# Patient Record
Sex: Female | Born: 1940 | Race: White | Hispanic: No | Smoking: Never smoker
Health system: Southern US, Community
[De-identification: ages and names within clinical notes are randomized; demographics above are authoritative.]

## PROBLEM LIST (undated history)

## (undated) DIAGNOSIS — I10 Essential (primary) hypertension: Secondary | ICD-10-CM

## (undated) DIAGNOSIS — E039 Hypothyroidism, unspecified: Secondary | ICD-10-CM

## (undated) DIAGNOSIS — E7800 Pure hypercholesterolemia, unspecified: Secondary | ICD-10-CM

## (undated) DIAGNOSIS — H409 Unspecified glaucoma: Secondary | ICD-10-CM

## (undated) DIAGNOSIS — M47812 Spondylosis without myelopathy or radiculopathy, cervical region: Secondary | ICD-10-CM

## (undated) DIAGNOSIS — G43109 Migraine with aura, not intractable, without status migrainosus: Secondary | ICD-10-CM

## (undated) DIAGNOSIS — M542 Cervicalgia: Secondary | ICD-10-CM

## (undated) HISTORY — DX: Spondylosis without myelopathy or radiculopathy, cervical region: M47.812

## (undated) HISTORY — DX: Pure hypercholesterolemia, unspecified: E78.00

## (undated) HISTORY — PX: TOTAL ABDOMINAL HYSTERECTOMY: SHX209

## (undated) HISTORY — DX: Unspecified glaucoma: H40.9

## (undated) HISTORY — DX: Hypothyroidism, unspecified: E03.9

## (undated) HISTORY — DX: Migraine with aura, not intractable, without status migrainosus: G43.109

## (undated) HISTORY — DX: Essential (primary) hypertension: I10

## (undated) HISTORY — DX: Cervicalgia: M54.2

---

## 1976-04-20 HISTORY — PX: CHOLECYSTECTOMY: SHX55

## 1997-07-16 ENCOUNTER — Ambulatory Visit (HOSPITAL_COMMUNITY): Admission: RE | Admit: 1997-07-16 | Discharge: 1997-07-16 | Payer: Self-pay | Admitting: Obstetrics and Gynecology

## 2010-04-20 HISTORY — PX: COLONOSCOPY: SHX174

## 2014-05-28 ENCOUNTER — Other Ambulatory Visit (HOSPITAL_COMMUNITY): Payer: Self-pay | Admitting: Internal Medicine

## 2014-05-28 DIAGNOSIS — G4489 Other headache syndrome: Secondary | ICD-10-CM

## 2014-05-30 ENCOUNTER — Ambulatory Visit (HOSPITAL_COMMUNITY)
Admission: RE | Admit: 2014-05-30 | Discharge: 2014-05-30 | Disposition: A | Discharge status: Home / Self Care | Payer: PPO | Source: Ambulatory Visit | Attending: Internal Medicine | Admitting: Internal Medicine

## 2014-05-30 DIAGNOSIS — R55 Syncope and collapse: Secondary | ICD-10-CM | POA: Insufficient documentation

## 2014-05-30 DIAGNOSIS — H539 Unspecified visual disturbance: Secondary | ICD-10-CM | POA: Insufficient documentation

## 2014-05-30 DIAGNOSIS — G4489 Other headache syndrome: Secondary | ICD-10-CM

## 2014-05-30 DIAGNOSIS — R0989 Other specified symptoms and signs involving the circulatory and respiratory systems: Secondary | ICD-10-CM | POA: Insufficient documentation

## 2014-05-30 DIAGNOSIS — I10 Essential (primary) hypertension: Secondary | ICD-10-CM | POA: Diagnosis not present

## 2014-07-12 ENCOUNTER — Other Ambulatory Visit (HOSPITAL_COMMUNITY): Payer: Self-pay | Admitting: Internal Medicine

## 2014-07-12 ENCOUNTER — Ambulatory Visit (HOSPITAL_COMMUNITY)
Admission: RE | Admit: 2014-07-12 | Discharge: 2014-07-12 | Disposition: A | Discharge status: Home / Self Care | Payer: PPO | Source: Ambulatory Visit | Attending: Internal Medicine | Admitting: Internal Medicine

## 2014-07-12 DIAGNOSIS — R202 Paresthesia of skin: Secondary | ICD-10-CM

## 2014-07-12 DIAGNOSIS — M5030 Other cervical disc degeneration, unspecified cervical region: Secondary | ICD-10-CM | POA: Insufficient documentation

## 2014-07-12 IMAGING — DX DG CERVICAL SPINE COMPLETE 4+V
6 series · 8 of 8 positions shown · non-contrast
Comparison: None.

CLINICAL DATA: Chronic pain.  Paresthesias.  No known injury.

EXAM:
CERVICAL SPINE  4+ VIEWS

[c-spine lat]
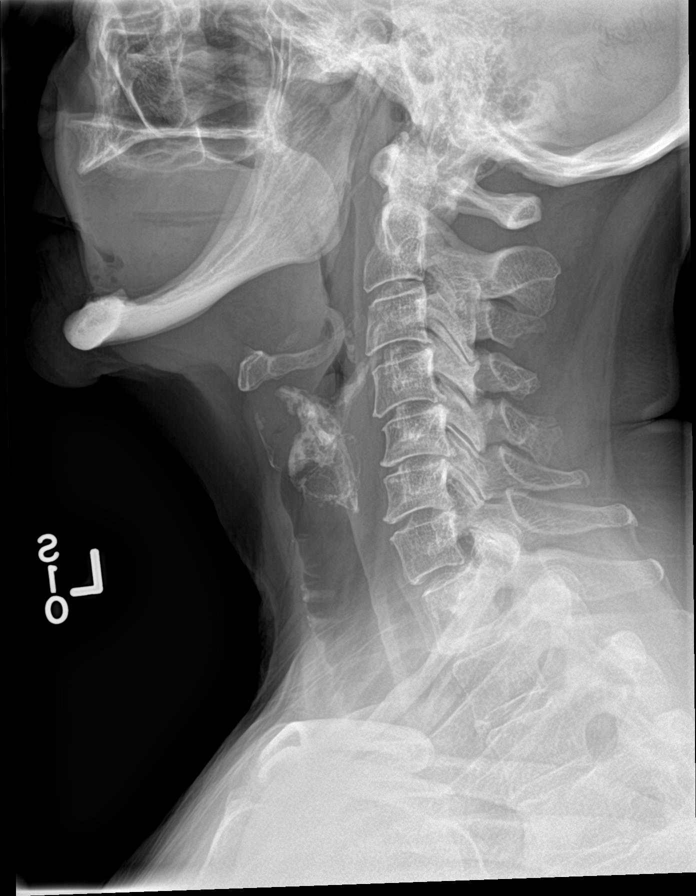

[c-spine obl (1 of 2)]
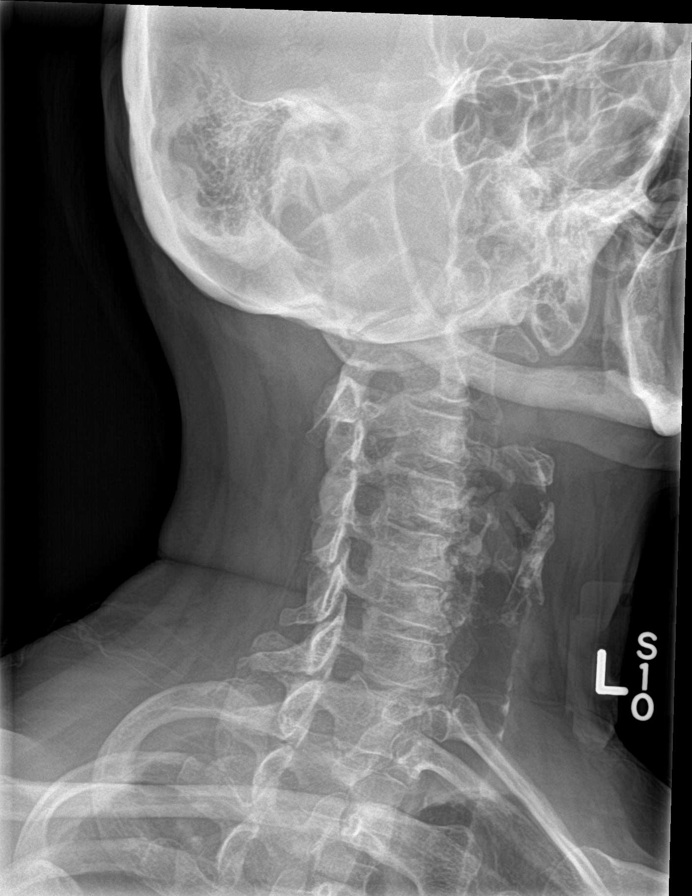

[c-spine obl (2 of 2)]
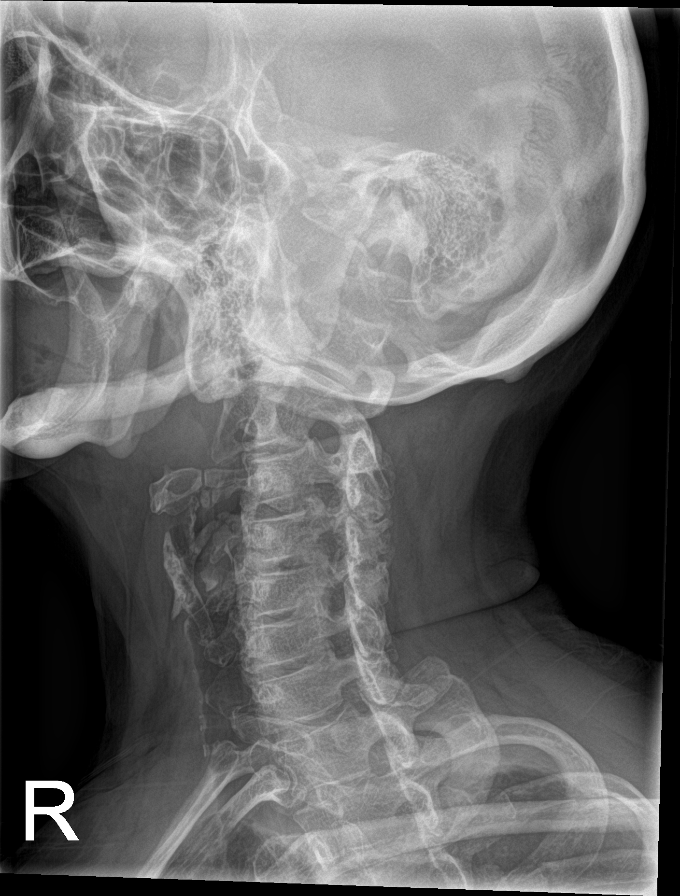

[c-spine ap]
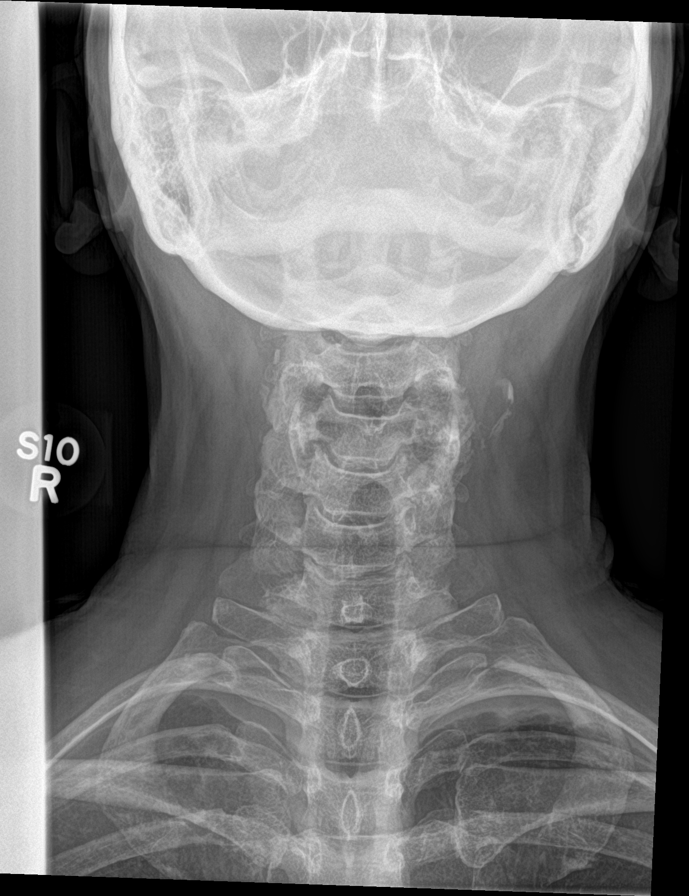

[Series 5: c-spine open mouth · 0.14mm/px · 3 of 3 slices shown]
[im 1/3]
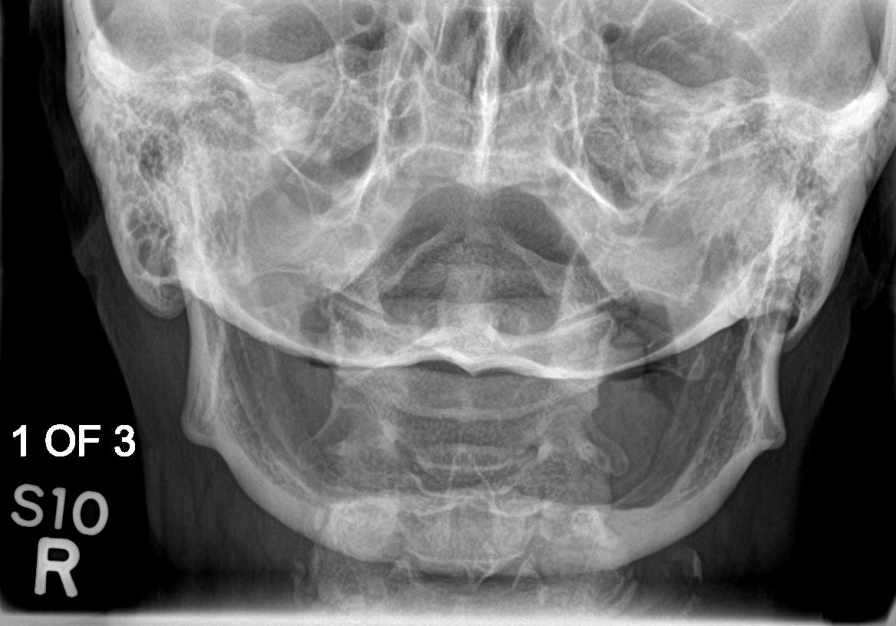
[im 2/3]
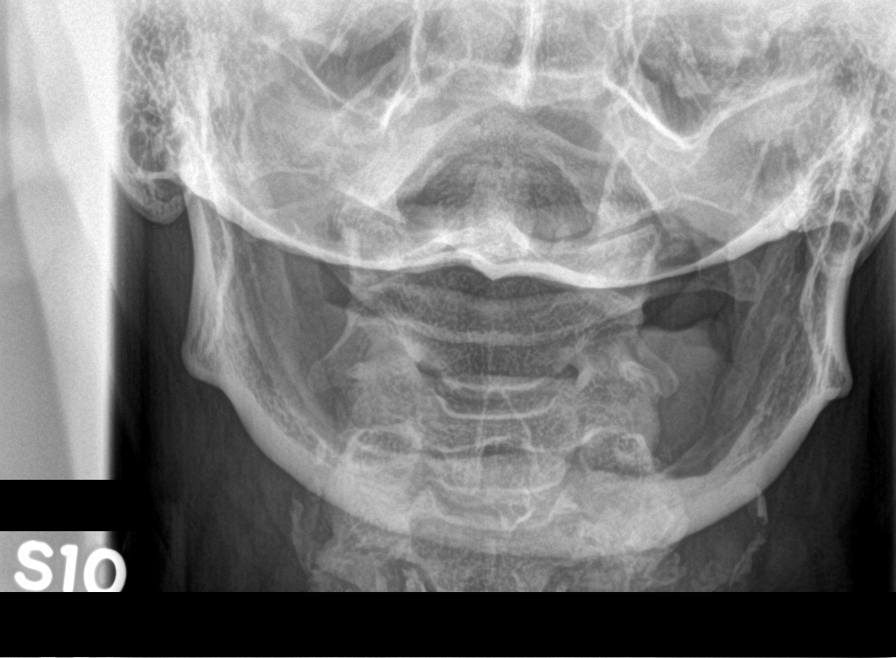
[im 3/3]
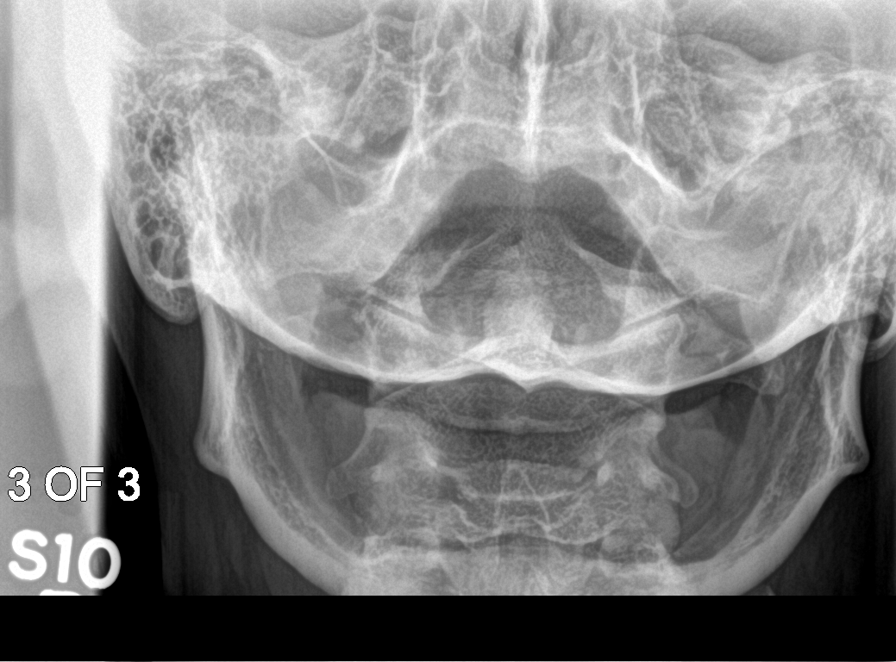

[[person_name]]
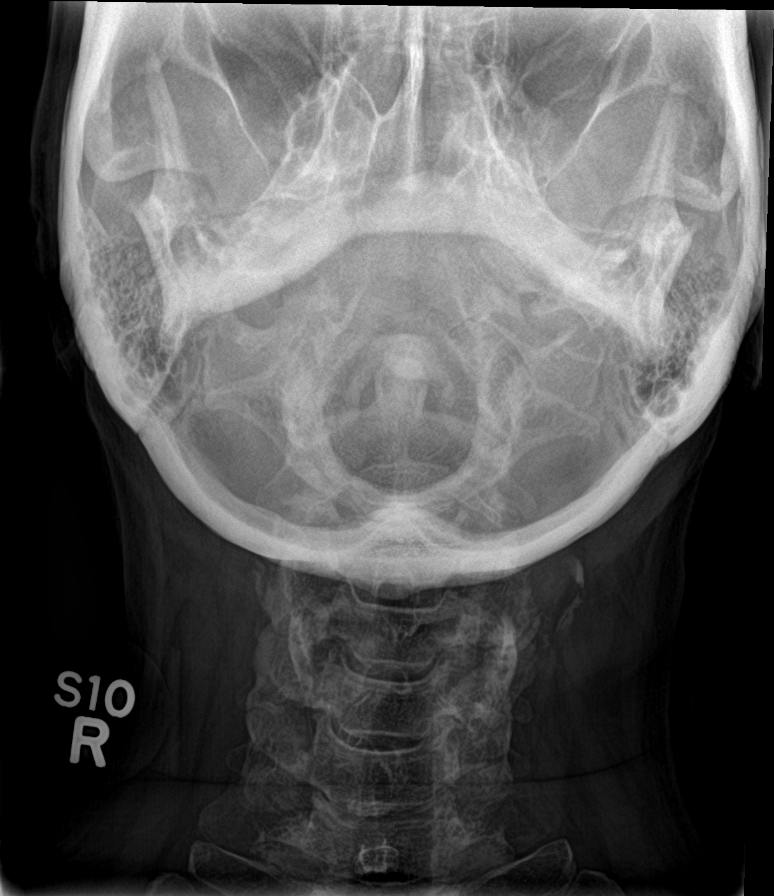

[8 of 8 positions shown; findings below may reference images not displayed]

FINDINGS: Diffuse multilevel degenerative change with endplate osteophyte
formation and disc space loss. Minimal anterolisthesis C4 on C5 of
approximately 2 mm noted. This may be degenerative. Mild narrowing
of the left C3-C4 and C4-C5 neural foramen. No evidence of fracture
dislocation. Carotid vascular calcification biapical pleural
parenchymal thickening noted most consistent with scarring.
IMPRESSION: 1. Degenerative changes cervical spine with minimal 2 mm
anterolisthesis C4 on C5, most likely degenerative. No acute
abnormality. Mild degenerative narrowing the left C3-C4 and C4-C5
neuroforamen.
2. Bilateral carotid atherosclerotic vascular disease.
3. Biapical pleural parenchymal scarring.

## 2014-07-30 ENCOUNTER — Ambulatory Visit (INDEPENDENT_AMBULATORY_CARE_PROVIDER_SITE_OTHER): Payer: PPO | Admitting: Neurology

## 2014-07-30 ENCOUNTER — Encounter: Payer: Self-pay | Admitting: Neurology

## 2014-07-30 VITALS — BP 145/69 | HR 65 | Ht 64.0 in | Wt 157.2 lb

## 2014-07-30 DIAGNOSIS — M47812 Spondylosis without myelopathy or radiculopathy, cervical region: Secondary | ICD-10-CM

## 2014-07-30 DIAGNOSIS — G43109 Migraine with aura, not intractable, without status migrainosus: Secondary | ICD-10-CM

## 2014-07-30 HISTORY — DX: Migraine with aura, not intractable, without status migrainosus: G43.109

## 2014-07-30 HISTORY — DX: Spondylosis without myelopathy or radiculopathy, cervical region: M47.812

## 2014-07-30 MED ORDER — ONDANSETRON 4 MG PO TBDP: 4.0000 mg | ORAL_TABLET | Freq: Three times a day (TID) | ORAL | Status: DC | PRN

## 2014-07-30 MED ORDER — GABAPENTIN 100 MG PO CAPS: ORAL_CAPSULE | ORAL | Status: DC

## 2014-07-30 MED ORDER — MELOXICAM 15 MG PO TABS: 15.0000 mg | ORAL_TABLET | Freq: Every day | ORAL | Status: DC

## 2014-07-30 NOTE — Progress Notes (Signed)
Reason for visit: Headache  Referring physician: Dr. Wende Neighbors  Tamara Sandoval is a 74 y.o. female  History of present illness:  Ms. Tamara Sandoval is a 74 year old right-handed white female with a history of classic migraine headache. The patient had only occasional headaches going up, but 5 years ago, she began having more frequent headache events. The headaches were associated with flashing lights in the left visual field that would gradually spread to the right side. The visual changes would last about 2 hours, and then a mild headache would ensue occurring along the frontal regions bilaterally. The headache will last about an hour. The patient has had only one other severe headache within the last 5 years, but she may get mild headaches that will come on about once a week. The headaches are associated with visual disturbances only in the left homonymous visual field, and the headaches are not severe. The patient may have some nausea, no vomiting. There may be some photophobia and phonophobia with the headache. She describes the headache as a pressure sensation. The patient may feel spacey after the headache is gone. She may have some dizziness with the headache, and some slight gait instability. The patient is also concerned about chronic neck discomfort that has been present again about 5 years. The patient has stiffness in the neck, and pain down into the shoulders and up behind the ears. More recently she has noted some discomfort into the right shoulder and upper arm that is a new feature. She indicates that the pain and stiffness increases with turning her head from one side to the next. She has undergone an x-ray of the cervical spine shows some spondylolisthesis at the C4-5 level. She has undergone CT scan evaluation of the brain that was unremarkable. She indicates that her daughter has migraine headaches that are frequent. She denies any issues controlling the bowels or the bladder. She is sent to  this office for further evaluation. She does not know of any particular activating factors for her headache.  Past Medical History  Diagnosis Date  . Glaucoma   . Hypertension   . Neck pain   . Hypercholesteremia   . 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 Cervical spondylosis without myelopathy 07/30/2014    Past Surgical History  Procedure Laterality Date  . Total abdominal hysterectomy    . Cholecystectomy  1978    Family History  Problem Relation Age of Onset  . Pneumonia Mother   . Tuberculosis Mother   . Hypertension Sister   . Cancer Brother   . Hypertension Maternal Aunt   . Dementia Paternal Aunt   . Cancer Cousin     ovarian  . Hypercholesterolemia Cousin   . Hypertension Sister     Social history:  reports that she has never smoked. She has never used smokeless tobacco. She reports that she does not drink alcohol or use illicit drugs.  Medications:  Prior to Admission medications   Medication Sig Start Date End Date Taking? Authorizing Provider  estradiol (ESTRACE) 1 MG tablet Take 1 mg by mouth daily.   Yes Historical Provider, MD  GuaiFENesin (COUGH SYRUP PO) Take by mouth.   Yes Historical Provider, MD  LATANOPROST OP Apply 1 drop to eye daily.   Yes Historical Provider, MD  levothyroxine (SYNTHROID, LEVOTHROID) 100 MCG tablet Take 100 mcg by mouth daily before breakfast.   Yes Historical Provider, MD  lisinopril (PRINIVIL,ZESTRIL) 10 MG tablet Take 10 mg by mouth daily.   Yes  Historical Provider, MD  naproxen sodium (ANAPROX) 220 MG tablet Take 220 mg by mouth as needed.   Yes Historical Provider, MD  omeprazole (PRILOSEC) 20 MG capsule Take 20 mg by mouth daily.   Yes Historical Provider, MD  potassium gluconate 595 MG TABS tablet Take 595 mg by mouth daily.   Yes Historical Provider, MD  timolol (BETIMOL) 0.5 % ophthalmic solution Place 1 drop into both eyes daily.   Yes Historical Provider, MD  triamterene-hydrochlorothiazide (DYAZIDE) 37.5-25 MG per capsule Take  1 capsule by mouth daily.   Yes Historical Provider, MD      Allergies  Allergen Reactions  . Bactrim [Sulfamethoxazole-Trimethoprim] Rash    ROS:  Out of a complete 14 system review of symptoms, the patient complains only of the following symptoms, and all other reviewed systems are negative.  Blurred vision Cough, wheezing Easy bruising Joint pain  Blood pressure 145/69, pulse 65, height 5\' 4"  (1.626 m), weight 157 lb 3.2 oz (71.305 kg).  Physical Exam  General: The patient is alert and cooperative at the time of the examination.  Eyes: Pupils are equal, round, and reactive to light. Discs are flat bilaterally.  Neck: The neck is supple, no carotid bruits are noted.  Respiratory: The respiratory examination is clear.  Cardiovascular: The cardiovascular examination reveals a regular rate and rhythm, no obvious murmurs or rubs are noted.  Neuromuscular: The patient lacks about 15 of lateral rotation of the cervical spine bilaterally.  Skin: Extremities are without significant edema.  Neurologic Exam  Mental status: The patient is alert and oriented x 3 at the time of the examination. The patient has apparent normal recent and remote memory, with an apparently normal attention span and concentration ability.  Cranial nerves: Facial symmetry is present. There is good sensation of the face to pinprick and soft touch bilaterally. The strength of the facial muscles and the muscles to head turning and shoulder shrug are normal bilaterally. Speech is well enunciated, no aphasia or dysarthria is noted. Extraocular movements are full. Visual fields are full. The tongue is midline, and the patient has symmetric elevation of the soft palate. No obvious hearing deficits are noted.  Motor: The motor testing reveals 5 over 5 strength of all 4 extremities. Good symmetric motor tone is noted throughout.  Sensory: Sensory testing is intact to pinprick, soft touch, vibration sensation, and  position sense on all 4 extremities. No evidence of extinction is noted.  Coordination: Cerebellar testing reveals good finger-nose-finger and heel-to-shin bilaterally.  Gait and station: Gait is normal. Tandem gait is normal. Romberg is negative. No drift is seen.  Reflexes: Deep tendon reflexes are symmetric and normal bilaterally. Toes are downgoing bilaterally.   CT brain 05/30/14:  IMPRESSION: Negative head CT.   Assessment/Plan:  1. Classic migraine  2. Cervical spondylosis, chronic neck pain  The patient appears to have a history consistent with classic migraine. Her daughter also has migraine headache. The patient indicates that her headaches are less bothersome to her than her neck discomfort currently. She has ongoing daily neck pain and stiffness, and some pain down into the right upper arm. The patient may have some slight irritation of the right C5 nerve root. She will be set up for neuromuscular therapy, and gabapentin will be added to the regimen taking 100 mg 3 times daily for 2 weeks, then go to 200 mg 3 times daily. She will be placed on Mobic 15 mg daily for the next 6-8 weeks. She will follow-up  in about 3 months. If the pain does not improve, MRI evaluation of the cervical spine can be done.  Jill Alexanders MD 07/30/2014 2:04 PM  [facility] [address] [place], [place] [address]  Phone [phone] Fax [phone]

## 2014-07-30 NOTE — Patient Instructions (Signed)

## 2014-07-31 ENCOUNTER — Telehealth: Payer: Self-pay | Admitting: Neurology

## 2014-07-31 NOTE — Telephone Encounter (Signed)
Angie Pharmacy Tech with [facility] @ [phone], stated insurance is questioning if patient is cancer patient for Rx ondansetron (ZOFRAN ODT) 4 MG disintegrating tablet.  If so would she use this medication after Chemo treatments.  Please call and advise.

## 2014-07-31 NOTE — Telephone Encounter (Signed)
This medication is being prescribed for nausea/vomiting associated with Migraine Headaches.  I called back.  Spoke with Angie.  Provided diagnosis.  She has noted fie and will contact us if anything further is needed.

## 2014-08-28 ENCOUNTER — Ambulatory Visit: Payer: PPO | Attending: Neurology

## 2014-08-28 DIAGNOSIS — G8929 Other chronic pain: Secondary | ICD-10-CM | POA: Insufficient documentation

## 2014-08-28 DIAGNOSIS — M79601 Pain in right arm: Secondary | ICD-10-CM | POA: Insufficient documentation

## 2014-08-28 DIAGNOSIS — R29898 Other symptoms and signs involving the musculoskeletal system: Secondary | ICD-10-CM | POA: Diagnosis not present

## 2014-08-28 DIAGNOSIS — M25611 Stiffness of right shoulder, not elsewhere classified: Secondary | ICD-10-CM

## 2014-08-28 DIAGNOSIS — M25511 Pain in right shoulder: Secondary | ICD-10-CM | POA: Insufficient documentation

## 2014-08-28 DIAGNOSIS — M542 Cervicalgia: Secondary | ICD-10-CM | POA: Diagnosis not present

## 2014-08-28 NOTE — Therapy (Signed)
[facility] [address] [place], [place], [address] Phone: [phone]   Fax:  [phone]  Physical Therapy Evaluation  Patient Details  Name: Tamara Sandoval MRN: [id number] Date of Birth: 06-21-1940 Referring Provider:  Kathrynn Ducking, MD  Encounter Date: 08/28/2014      PT End of Session - 08/28/14 1742    Visit Number 1   Number of Visits 17   Date for PT Re-Evaluation 10/27/14   Authorization Type [employer]-G code required every 10th visit   PT Start Time 1230   PT Stop Time 1320   PT Time Calculation (min) 50 min      Past Medical History  Diagnosis Date  . Glaucoma   . Hypertension   . Neck pain   . Hypercholesteremia   . 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 Cervical spondylosis without myelopathy 07/30/2014    Past Surgical History  Procedure Laterality Date  . Total abdominal hysterectomy    . Cholecystectomy  1978    There were no vitals filed for this visit.  Visit Diagnosis:  Neck pain of over 3 months duration  Right arm pain  Decreased range of motion of neck  Decreased range of motion of right shoulder      Subjective Assessment - 08/28/14 1239    Subjective Pt has had pain and swelling on the right side of the arm and neck for (and pain progressing to R shoulder) about 6 months and on the left side for 1 year. The right side is progressively worsening and left side stays at a constant level of pain and swelling, not worsening over time.  Pt has been taking anti-inflammatory medication for the pain since she saw her neurologist last month. She feels like the medication is not helping much. She uses heat at night and this is occasionally helpful. Pt reports that at night she has a tough time finding a comfortable position and must straighten right elbow to get some relief.     Pertinent History Hx of left shoulder rotator cuff surgery 3 years ago; Pt had the Flu in January and again  in March of this year.    Patient Stated Goals decrease neck and arm pain, increase strength of arm, and increasing the range of motion of her neck    Currently in Pain? Yes   Pain Score 5    Pain Location --  both sides of neck, right shoulder/arm,    Pain Descriptors / Indicators --  R arm pain is occasionally burning and occasionally sharp, occasionally has paresthesias in left and right hand,    Pain Type Chronic pain   Pain Onset --  6-12 months ago   Pain Frequency Constant   Aggravating Factors  movement of the arms/shoulders/neck   Pain Relieving Factors medication helps only a little, heat also helps   Effect of Pain on Daily Activities unable to use the R arm for bathing, and chores, or even pouring coffee            [facility] PT Assessment - 08/28/14 0001    Assessment   Medical Diagnosis Cervical Spondylosis M47.812   Onset Date --  1 year ago   Prior Therapy L shoulder replacement 3 years ago   Precautions   Precautions None   Balance Screen   Has the patient fallen in the past 6 months No   How many times? 0   Has the patient had a decrease in activity level  because of a fear of falling?  Yes  reports she has decrased climbing ladders because of a fear   Is the patient reluctant to leave their home because of a fear of falling?  No   Home Environment   Living Enviornment Private residence   Living Arrangements Spouse/significant other   Available Help at Discharge Family   Type of Meta to enter   Entrance Stairs-Number of Steps 4   Entrance Stairs-Rails Left   Home Layout Two level   Alternate Level Stairs-Number of Steps 14   Prior Function   Level of Independence Independent with gait;Independent with transfers  currently independent   Leisure reading, sewing/quilting,    Observation/Other Assessments   Focus on Therapeutic Outcomes (FOTO)  Functional Status18   Other Surveys  Select  Neck Disability Index (NDI) 40%    Posture/Postural Control   Posture/Postural Control Postural limitations   Postural Limitations Forward head;Posterior pelvic tilt      Observation: Swelling of upper traps region bilateral, sterno cleido mastiod region, and front upper portion of neck and chest  AROM: Full AROM of : shoulder flexion bilaterally (with overactive upper traps), for shoulder abduction pt able to abduct to 90 degrees bilaterally then moves in to scaption position for remaining 90 degrees; decreased R shoulder internal rotation unable to reach mid back with RUE (reaches buttock only); has full shoulder external rotation of RUE but has pain with return to neutral. Shoulder extension limited to 50%  LUE all AROM WNL  RUE strength Biceps and triceps 5/5 Shoulder flexion 4-/5 painful end feel Shoulder extension 4+/5 Shoulder abduction 3+/5  Neck AROM  L rotation 20 degrees (ipsilateral "pulling" sensation) R rotation 24 degrees (pt feels ipsilateral "pulling" sensation) Flexion and extension also limited Sidebending left and right very limited (most limited aspect of neck AROM)  Other subjective reports:  -Only able to hold a book to read for 10 minutes prior to onset of pan and/or paresthesias in both hands. -Must use LUE to assist hand for lifting coffee pot or to assist for cooking, doing laundry or washing dishes -Unable to use the R hand to wash her back while bathing due to pain and decreased AROM -due to decreased cervical AROM has difficulty scanning environment while driving (must turn whole body to look into the next lane), and has difficulty extending neck to look into cupboard while doing dishes.                        PT Education - 08/28/14 1741    Education provided Yes   Education Details pt taught to perform sterno cleido mastoid stretch, also taught to perform self lymphatic drainage and demonstrated correct performance of each of these. Did not have time to print  handouts on eval day   Person(s) Educated Patient   Methods Explanation;Demonstration;Tactile cues;Verbal cues   Comprehension Verbalized understanding;Returned demonstration          PT Short Term Goals - 08/28/14 1751    PT SHORT TERM GOAL #1   Title Pt will demonstrate ability to perform cervical rotation R to 30 degrees for improved scanning environment. Target 09/28/14   PT SHORT TERM GOAL #2   Title Pt will perform cervical rotation left to 25 degrees for improved scanning of environment. Target 09/28/14   PT SHORT TERM GOAL #3   Title Pt will demonstrate correct performance of HEP for improved neck AROM, R shoulder  AROM and strength. Target 09/28/14   PT SHORT TERM GOAL #4   Title Pt will report or demonstrate ability to read book for >15 minutes prior to experiencing paresthesias in hands for increased quailty of life. (was 10 minutes on eval) Target 09/28/14   PT SHORT TERM GOAL #5   Title Pt will report ability to use RUE independently for unlaoding 50% of dishwasher for increased RUE strength and range of motion for IADLs. Target 09/28/14           PT Long Term Goals - Sep 21, 2014 1801    PT LONG TERM GOAL #1   Title Pt will improve neck disability index to 25% disability on FOTO NDI test. Target 10/28/14   PT LONG TERM GOAL #2   Title Pt will increase neck AROM rotation R to 35 degress for improving ability to scan environment while driving. Target 10/28/14   PT LONG TERM GOAL #3   Title Pt will incrase neck AROM rotation L to 30 degrees for improving ability to scan environemnt. Target 10/28/14   PT LONG TERM GOAL #4   Title Pt will report increased ease with looking into cupboards due to increased cervical extension and decrased pain. Target 10/28/14   PT LONG TERM GOAL #5   Title Pt will demonstrate ability to reach her mid back with the RUE to assist with bathing for improved hygiene. Target 10/28/14               Plan - 09/21/14 1743    Clinical Impression  Statement Pt has multifactorial neck pain and R upper extremity pain, weakness and decreased AROM. Pt's neck pain appears to be affected by increased fluid retention/swelling of the neck and upper torso as well as spondylolisthesis of C4-C5 which was present on x-ray. Pt has had an unremarkable brain CT scan. Pt will benefit from skilled PT services for shoulder strengthening, increasing neck and shoulder AROM and decreasing pain so that pt is able to perform her daily activities more independently.    Pt will benefit from skilled therapeutic intervention in order to improve on the following deficits Pain;Increased edema;Impaired flexibility;Decreased strength;Postural dysfunction;Decreased range of motion;Impaired UE functional use   Rehab Potential Good   PT Frequency 2x / week   PT Duration 8 weeks   PT Treatment/Interventions ADLs/Self Care Home Management;Patient/family education;Therapeutic exercise;Neuromuscular re-education;Manual techniques;Passive range of motion;Traction;Therapeutic activities;Manual lymph drainage   PT Next Visit Plan Reivew sterno cleido mastoid stretch and self-manual lymph drainage taught on eval--provide typed HEP for these and other neck stretching and R shoulder stretching/strengthing. Manual therapy and traction as needed.           G-Codes - 21-Sep-2014 1808    Functional Assessment Tool Used Neck Disability Index (FOTO) 40%   Functional Limitation Changing and maintaining body position   Changing and Maintaining Body Position Current Status (W2993) At least 40 percent but less than 60 percent impaired, limited or restricted   Changing and Maintaining Body Position Goal Status (Z1696) At least 20 percent but less than 40 percent impaired, limited or restricted       Problem List Patient Active Problem List   Diagnosis Date Noted  . 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 Cervical spondylosis without myelopathy 07/30/2014    Delrae Sawyers,  PT,DPT,NCS 21-Sep-2014 6:10 PM Phone [phone] FAX [phone]         [facility] [address] [place], [place], [address] Phone: [phone]   Fax:  [phone]

## 2014-09-05 ENCOUNTER — Ambulatory Visit (HOSPITAL_COMMUNITY): Payer: PPO | Attending: Neurology | Admitting: Physical Therapy

## 2014-09-05 DIAGNOSIS — M542 Cervicalgia: Secondary | ICD-10-CM

## 2014-09-05 DIAGNOSIS — M25611 Stiffness of right shoulder, not elsewhere classified: Secondary | ICD-10-CM

## 2014-09-05 DIAGNOSIS — G8929 Other chronic pain: Secondary | ICD-10-CM

## 2014-09-05 DIAGNOSIS — R29898 Other symptoms and signs involving the musculoskeletal system: Secondary | ICD-10-CM

## 2014-09-05 DIAGNOSIS — M5382 Other specified dorsopathies, cervical region: Secondary | ICD-10-CM | POA: Diagnosis not present

## 2014-09-05 DIAGNOSIS — M25512 Pain in left shoulder: Secondary | ICD-10-CM | POA: Insufficient documentation

## 2014-09-05 DIAGNOSIS — M25511 Pain in right shoulder: Secondary | ICD-10-CM | POA: Insufficient documentation

## 2014-09-05 DIAGNOSIS — M25811 Other specified joint disorders, right shoulder: Secondary | ICD-10-CM | POA: Insufficient documentation

## 2014-09-05 DIAGNOSIS — M79601 Pain in right arm: Secondary | ICD-10-CM

## 2014-09-05 DIAGNOSIS — M25812 Other specified joint disorders, left shoulder: Secondary | ICD-10-CM | POA: Diagnosis not present

## 2014-09-05 NOTE — Therapy (Signed)
[facility] [place], [place], [address] Phone: [phone]   Fax:  [phone]  Physical Therapy Treatment  Patient Details  Name: Tamara Sandoval MRN: [id number] Date of Birth: 18-Sep-1940 Referring Provider:  Kathrynn Ducking, MD  Encounter Date: 09/05/2014      PT End of Session - 09/05/14 1613    Visit Number 2   Number of Visits 17   Date for PT Re-Evaluation 10/27/14   Authorization Type [employer]-G code required every 10th visit   PT Start Time 1520   PT Stop Time 1605   PT Time Calculation (min) 45 min   Activity Tolerance Patient tolerated treatment well      Past Medical History  Diagnosis Date  . Glaucoma   . Hypertension   . Neck pain   . Hypercholesteremia   . 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 Cervical spondylosis without myelopathy 07/30/2014    Past Surgical History  Procedure Laterality Date  . Total abdominal hysterectomy    . Cholecystectomy  1978    There were no vitals filed for this visit.  Visit Diagnosis:  Neck pain of over 3 months duration  Right arm pain  Decreased range of motion of neck  Decreased range of motion of right shoulder      Subjective Assessment - 09/05/14 1528    Subjective Ms. Hettich states that she probably has not been doing her exercises as she should be.    Currently in Pain? Yes   Pain Score 8    Pain Location Neck   Pain Orientation Right;Left   Pain Descriptors / Indicators Aching   Pain Type Chronic pain   Pain Frequency Constant               [facility] Adult PT Treatment/Exercise - 09/05/14 0001    Exercises   Exercises Neck   Neck Exercises: Seated   Neck Retraction 5 reps   Cervical Rotation Both;5 reps   Lateral Flexion Both;5 reps   Shoulder Shrugs 5 reps   Shoulder Flexion Both;5 reps;Other (comment)   Shoulder Flexion Weights (lbs) wand    Other Seated Exercise scapular retraction x 10    Manual Therapy   Manual Therapy  Soft tissue mobilization   Manual therapy comments marked mm spasm B upper trap area.                 PT Education - 09/05/14 1612    Education provided Yes   Education Details New exercises for 3-D cervical excursion as well as shoulder shrugs and B UE flexion with wand    Person(s) Educated Patient   Methods Explanation;Demonstration   Comprehension Returned demonstration;Verbalized understanding          PT Short Term Goals - 09/05/14 1615    PT SHORT TERM GOAL #1   Title Pt will demonstrate ability to perform cervical rotation R to 30 degrees for improved scanning environment. Target 09/28/14   Status On-going   PT SHORT TERM GOAL #2   Title Pt will perform cervical rotation left to 25 degrees for improved scanning of environment. Target 09/28/14   Status On-going   PT SHORT TERM GOAL #3   Title Pt will demonstrate correct performance of HEP for improved neck AROM, R shoulder AROM and strength. Target 09/28/14   Status On-going   PT SHORT TERM GOAL #4   Title Pt will report or demonstrate ability to read book for >15 minutes prior to  experiencing paresthesias in hands for increased quailty of life. (was 10 minutes on eval) Target 09/28/14   Status On-going   PT SHORT TERM GOAL #5   Title Pt will report ability to use RUE independently for unlaoding 50% of dishwasher for increased RUE strength and range of motion for IADLs. Target 09/28/14   Status On-going           PT Long Term Goals - 09/05/14 1616    PT LONG TERM GOAL #1   Title Pt will improve neck disability index to 25% disability on FOTO NDI test. Target 10/28/14   Status On-going   PT LONG TERM GOAL #2   Title Pt will increase neck AROM rotation R to 35 degress for improving ability to scan environment while driving. Target 10/28/14   Status On-going   PT LONG TERM GOAL #3   Title Pt will incrase neck AROM rotation L to 30 degrees for improving ability to scan environemnt. Target 10/28/14   Status On-going    PT LONG TERM GOAL #4   Title Pt will report increased ease with looking into cupboards due to increased cervical extension and decrased pain. Target 10/28/14   Status On-going   PT LONG TERM GOAL #5   Title Pt will demonstrate ability to reach her mid back with the RUE to assist with bathing for improved hygiene. Target 10/28/14   Status On-going               Plan - 09/05/14 1613    Clinical Impression Statement Pt cervical mm tested with noted weakness.  Pt has marked mm spasm in B trap that was decreased but not abolished with manual techniques.  Pt has limited cervical and shoulder ROM and strength.  Pain decreased to a 5/10 after sesssion.    PT Next Visit Plan begin cervical isometrics, W-back and T-band for posture.         Problem List Patient Active Problem List   Diagnosis Date Noted  . 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 Cervical spondylosis without myelopathy 07/30/2014   Rayetta Humphrey, PT CLT [phone] 09/05/2014, 4:17 PM  [facility] [address] [place], [place], [address] Phone: [phone]   Fax:  [phone]

## 2014-09-05 NOTE — Patient Instructions (Signed)
Scapular Retraction (Standing)   With arms at sides, pinch shoulder blades together. Repeat __10__ times per set. Do _1___ sets per session. Do __3__ sessions per day.  http://orth.exer.us/944   Copyright  VHI. All rights reserved.  ROM: Flexion - Wand   Bring wand directly over head, leading with right side. Reach back until stretch is felt. Hold __3__ seconds. Repeat _10___ times per set. Do __1__ sets per session. Do _2___ sessions per day.  http://orth.exer.us/744   Copyright  VHI. All rights reserved.  Strengthening: Shoulder Shrug (Phase 1)   Shrug shoulders up  Backward now relax  Repeat __10__ times per set. Do __1__ sets per session. Do __2__ sessions per day.  http://orth.exer.us/336   Copyright  VHI. All rights reserved.  Flexibility: Neck Retraction   Pull head straight back, keeping eyes and jaw level. Repeat _10___ times per set. Do ___1_ sets per session. Do __2__ sessions per day.  http://orth.exer.us/344   Copyright  VHI. All rights reserved.

## 2014-09-12 ENCOUNTER — Telehealth (HOSPITAL_COMMUNITY): Payer: Self-pay | Admitting: Physical Therapy

## 2014-09-12 ENCOUNTER — Ambulatory Visit (HOSPITAL_COMMUNITY): Payer: PPO | Admitting: Physical Therapy

## 2014-09-12 NOTE — Telephone Encounter (Signed)
Patient a no show for her appointment on 5/25. Attempted to call patient however she did not answer phone call; left message detailing date and time of next appointment.  Deniece Ree PT, DPT [phone]

## 2014-09-13 ENCOUNTER — Other Ambulatory Visit (HOSPITAL_COMMUNITY): Payer: Self-pay | Admitting: Internal Medicine

## 2014-09-13 DIAGNOSIS — Z1231 Encounter for screening mammogram for malignant neoplasm of breast: Secondary | ICD-10-CM

## 2014-09-13 DIAGNOSIS — M810 Age-related osteoporosis without current pathological fracture: Secondary | ICD-10-CM

## 2014-09-14 ENCOUNTER — Ambulatory Visit (HOSPITAL_COMMUNITY): Payer: PPO

## 2014-09-14 ENCOUNTER — Telehealth (HOSPITAL_COMMUNITY): Payer: Self-pay

## 2014-09-14 DIAGNOSIS — M25611 Stiffness of right shoulder, not elsewhere classified: Secondary | ICD-10-CM

## 2014-09-14 DIAGNOSIS — M79601 Pain in right arm: Secondary | ICD-10-CM

## 2014-09-14 DIAGNOSIS — G8929 Other chronic pain: Principal | ICD-10-CM

## 2014-09-14 DIAGNOSIS — M542 Cervicalgia: Secondary | ICD-10-CM | POA: Diagnosis not present

## 2014-09-14 DIAGNOSIS — R29898 Other symptoms and signs involving the musculoskeletal system: Secondary | ICD-10-CM

## 2014-09-14 NOTE — Therapy (Signed)
[place], [place], [address] Phone: [phone]   Fax:  [phone]  Physical Therapy Treatment  Patient Details  Name: Tamara Sandoval MRN: [id number] Date of Birth: January 22, 1941 Referring Provider:  Kathrynn Ducking, MD  Encounter Date: 09/14/2014      PT End of Session - 09/14/14 1543    Visit Number 3   Number of Visits 17   Date for PT Re-Evaluation 10/27/14   Authorization Type [employer]-G code required every 10th visit   Authorization - Visit Number 3   Authorization - Number of Visits 10   PT Start Time 3474   PT Stop Time 1430   PT Time Calculation (min) 53 min   Activity Tolerance Patient tolerated treatment well   Behavior During Therapy [facility] for tasks assessed/performed      Past Medical History  Diagnosis Date  . Glaucoma   . Hypertension   . Neck pain   . Hypercholesteremia   . 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 Cervical spondylosis without myelopathy 07/30/2014    Past Surgical History  Procedure Laterality Date  . Total abdominal hysterectomy    . Cholecystectomy  1978    There were no vitals filed for this visit.  Visit Diagnosis:  Neck pain of over 3 months duration  Right arm pain  Decreased range of motion of neck  Decreased range of motion of right shoulder      Subjective Assessment - 09/14/14 1539    Subjective Pt stated she continues to have lateral side and posterior neck pain with radicular symptoms past Rt elbow.     Currently in Pain? Yes   Pain Score 4    Pain Location Neck   Pain Orientation Right;Left   Pain Descriptors / Indicators Aching               [facility] Adult PT Treatment/Exercise - 09/14/14 0001    Exercises   Exercises Neck   Neck Exercises: Theraband   Scapula Retraction 10 reps;Green   Shoulder Extension 10 reps;Green   Rows 10 reps;Green   Neck Exercises: Seated   Cervical Isometrics Right lateral flexion;Left lateral  flexion;Right rotation;Left rotation;5 secs;5 reps   Neck Retraction 10 reps;5 secs   Cervical Rotation Both;10 reps   Cervical Rotation Limitations 3D cervical excursion   Lateral Flexion Both;10 reps   Lateral Flexion Limitations Rt 90 degrees; Lt 100 degrees   Shoulder Shrugs 10 reps   Shoulder Shrugs Limitations shoulders up, back, down and relax   Neck Exercises: Stretches   Upper Trapezius Stretch 3 reps;30 seconds   Corner Stretch 3 reps;30 seconds                PT Education - 09/14/14 1555    Education provided Yes   Education Details Education on proper posture landmarks   Person(s) Educated Patient   Methods Explanation;Demonstration   Comprehension Verbalized understanding;Returned demonstration          PT Short Term Goals - 09/14/14 1545    PT SHORT TERM GOAL #1   Title Pt will demonstrate ability to perform cervical rotation R to 30 degrees for improved scanning environment. Target 09/28/14   Status On-going   PT SHORT TERM GOAL #2   Title Pt will perform cervical rotation left to 25 degrees for improved scanning of environment. Target 09/28/14   Status On-going   PT SHORT TERM GOAL #3   Title Pt will demonstrate correct performance of  HEP for improved neck AROM, R shoulder AROM and strength. Target 09/28/14   Status On-going   PT SHORT TERM GOAL #4   Title Pt will report or demonstrate ability to read book for >15 minutes prior to experiencing paresthesias in hands for increased quailty of life. (was 10 minutes on eval) Target 09/28/14   PT SHORT TERM GOAL #5   Title Pt will report ability to use RUE independently for unlaoding 50% of dishwasher for increased RUE strength and range of motion for IADLs. Target 09/28/14   Status On-going           PT Long Term Goals - 09/14/14 1546    PT LONG TERM GOAL #1   Title Pt will improve neck disability index to 25% disability on FOTO NDI test. Target 10/28/14   PT LONG TERM GOAL #2   Title Pt will increase  neck AROM rotation R to 35 degress for improving ability to scan environment while driving. Target 10/28/14   PT LONG TERM GOAL #3   Title Pt will incrase neck AROM rotation L to 30 degrees for improving ability to scan environemnt. Target 10/28/14   PT LONG TERM GOAL #4   Title Pt will report increased ease with looking into cupboards due to increased cervical extension and decrased pain. Target 10/28/14   PT LONG TERM GOAL #5   Title Pt will demonstrate ability to reach her mid back with the RUE to assist with bathing for improved hygiene. Target 10/28/14               Plan - 09/14/14 1556    Clinical Impression Statement Pt educated on proper posutre landmarks.  Progressed cervical strengthening with isometrics and began posture strengthening exercises.  Pt c/o pulling in Bil pecs and upper and mid traps during [facility].  Added corner and upper trap stretches to improve flexibilty.  Therapist facilitation for proper form and techniques with exercises.  Ended session with manual to reduce spasms upper, mid trap and rhomboid region for pain control.  Encouraged pt to stay hydratted to reduce risk of headache following manual.     PT Next Visit Plan Next session begin 3D thoracic excursion initially with UE across chest then progress with UE movements.  Continue cervical isometric strenghtening then HEP.  Continue cervical mobilty and progress posture strengthening.          Problem List Patient Active Problem List   Diagnosis Date Noted  . 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 Cervical spondylosis without myelopathy 07/30/2014   Aldona Lento, PTA  Aldona Lento 09/14/2014, 4:41 PM  [facility] [address] [place], [place], [address] Phone: [phone]   Fax:  [phone]

## 2014-09-18 ENCOUNTER — Encounter (HOSPITAL_COMMUNITY): Payer: PPO

## 2014-09-20 ENCOUNTER — Encounter (HOSPITAL_COMMUNITY): Payer: PPO

## 2014-09-25 ENCOUNTER — Ambulatory Visit (HOSPITAL_COMMUNITY): Payer: PPO | Attending: Neurology | Admitting: Physical Therapy

## 2014-09-25 DIAGNOSIS — M25811 Other specified joint disorders, right shoulder: Secondary | ICD-10-CM | POA: Diagnosis not present

## 2014-09-25 DIAGNOSIS — M5382 Other specified dorsopathies, cervical region: Secondary | ICD-10-CM | POA: Insufficient documentation

## 2014-09-25 DIAGNOSIS — M542 Cervicalgia: Secondary | ICD-10-CM | POA: Insufficient documentation

## 2014-09-25 DIAGNOSIS — R29898 Other symptoms and signs involving the musculoskeletal system: Secondary | ICD-10-CM

## 2014-09-25 DIAGNOSIS — G8929 Other chronic pain: Secondary | ICD-10-CM

## 2014-09-25 DIAGNOSIS — M25812 Other specified joint disorders, left shoulder: Secondary | ICD-10-CM | POA: Diagnosis not present

## 2014-09-25 DIAGNOSIS — M79601 Pain in right arm: Secondary | ICD-10-CM

## 2014-09-25 DIAGNOSIS — M25512 Pain in left shoulder: Secondary | ICD-10-CM | POA: Diagnosis not present

## 2014-09-25 DIAGNOSIS — M25511 Pain in right shoulder: Secondary | ICD-10-CM | POA: Diagnosis not present

## 2014-09-25 DIAGNOSIS — M25611 Stiffness of right shoulder, not elsewhere classified: Secondary | ICD-10-CM

## 2014-09-25 NOTE — Therapy (Signed)
[place], [place], [address] Phone: [phone]   Fax:  [phone]  Physical Therapy Treatment  Patient Details  Name: Tamara Sandoval MRN: [id number] Date of Birth: 10-23-1940 Referring Provider:  Delphina Cahill, MD  Encounter Date: 09/25/2014      PT End of Session - 09/25/14 1742    Visit Number 4   Number of Visits 17   Date for PT Re-Evaluation 10/27/14   Authorization Type [employer]-G code required every 10th visit   Authorization - Visit Number 4   Authorization - Number of Visits 10   PT Start Time 2458   PT Stop Time 1600   PT Time Calculation (min) 42 min   Activity Tolerance Patient tolerated treatment well   Behavior During Therapy [facility] for tasks assessed/performed      Past Medical History  Diagnosis Date  . Glaucoma   . Hypertension   . Neck pain   . Hypercholesteremia   . 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 Cervical spondylosis without myelopathy 07/30/2014    Past Surgical History  Procedure Laterality Date  . Total abdominal hysterectomy    . Cholecystectomy  1978    There were no vitals filed for this visit.  Visit Diagnosis:  Neck pain of over 3 months duration  Right arm pain  Decreased range of motion of neck  Decreased range of motion of right shoulder      Subjective Assessment - 09/25/14 1526    Subjective Pt states her neck is feeling better over.  Reports it as a 5/10, however states this is low for her (it is higher than last visit report of 4/10)   Currently in Pain? Yes   Pain Score 5    Pain Location Neck   Pain Orientation Other (Comment)                         [facility] Adult PT Treatment/Exercise - 09/25/14 1528    Neck Exercises: Theraband   Scapula Retraction 10 reps;Green   Shoulder Extension 10 reps;Green   Rows 10 reps;Green   Neck Exercises: Seated   Neck Retraction 10 reps;5 secs   Cervical Rotation Both;10 reps   Cervical  Rotation Limitations 3D cervical excursion   Lateral Flexion Both;10 reps   Lateral Flexion Limitations Rt 90 degrees; Lt 100 degrees   Shoulder Shrugs 10 reps   Shoulder Shrugs Limitations shoulders up, back, down and relax   Shoulder Flexion 10 reps   Shoulder Flexion Weights (lbs) wand 1#   Shoulder ABduction Both;10 reps   Shoulder Abduction Weights (lbs) wand 1#   Other Seated Exercise thoracic 2D excursions UE's crossed 10 reps   Manual Therapy   Manual Therapy Soft tissue mobilization   Manual therapy comments bilateral UT in seated position   Neck Exercises: Stretches   Upper Trapezius Stretch 3 reps;30 seconds   Corner Stretch 10 seconds  10 reps                  PT Short Term Goals - 09/14/14 1545    PT SHORT TERM GOAL #1   Title Pt will demonstrate ability to perform cervical rotation R to 30 degrees for improved scanning environment. Target 09/28/14   Status On-going   PT SHORT TERM GOAL #2   Title Pt will perform cervical rotation left to 25 degrees for improved scanning of environment. Target 09/28/14   Status On-going  PT SHORT TERM GOAL #3   Title Pt will demonstrate correct performance of HEP for improved neck AROM, R shoulder AROM and strength. Target 09/28/14   Status On-going   PT SHORT TERM GOAL #4   Title Pt will report or demonstrate ability to read book for >15 minutes prior to experiencing paresthesias in hands for increased quailty of life. (was 10 minutes on eval) Target 09/28/14   PT SHORT TERM GOAL #5   Title Pt will report ability to use RUE independently for unlaoding 50% of dishwasher for increased RUE strength and range of motion for IADLs. Target 09/28/14   Status On-going           PT Long Term Goals - 09/14/14 1546    PT LONG TERM GOAL #1   Title Pt will improve neck disability index to 25% disability on FOTO NDI test. Target 10/28/14   PT LONG TERM GOAL #2   Title Pt will increase neck AROM rotation R to 35 degress for improving  ability to scan environment while driving. Target 10/28/14   PT LONG TERM GOAL #3   Title Pt will incrase neck AROM rotation L to 30 degrees for improving ability to scan environemnt. Target 10/28/14   PT LONG TERM GOAL #4   Title Pt will report increased ease with looking into cupboards due to increased cervical extension and decrased pain. Target 10/28/14   PT LONG TERM GOAL #5   Title Pt will demonstrate ability to reach her mid back with the RUE to assist with bathing for improved hygiene. Target 10/28/14               Plan - 09/25/14 1615    Clinical Impression Statement Visibly improved posture.  Continued with established exercises.  Pt with noted reduction in cervical ROM; Pt unable to side bend to Rt without rotation preceding movment.  Pt with very tight upper traps with multiple spasms.   Able to reduce with trigger point and solft tissue mobilization.   Added thoracic excursions with good form .  Also with improving Rt UE strength   PT Next Visit Plan Progress 3D thoracic exursions with UE movements.  Continue cervical isometric strenghtening then HEP.  Continue cervical mobilty and progress posture strengthening.          Problem List Patient Active Problem List   Diagnosis Date Noted  . 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 Cervical spondylosis without myelopathy 07/30/2014    Teena Irani, PTA/CLT [phone]  09/25/2014, 5:46 PM  [place], [place], [address] Phone: [phone]   Fax:  [phone]

## 2014-09-26 ENCOUNTER — Other Ambulatory Visit (HOSPITAL_COMMUNITY): Payer: Self-pay | Admitting: Internal Medicine

## 2014-09-26 DIAGNOSIS — M810 Age-related osteoporosis without current pathological fracture: Secondary | ICD-10-CM

## 2014-09-27 ENCOUNTER — Ambulatory Visit (HOSPITAL_COMMUNITY): Payer: PPO | Admitting: Physical Therapy

## 2014-09-27 ENCOUNTER — Ambulatory Visit (HOSPITAL_COMMUNITY)
Admission: RE | Admit: 2014-09-27 | Discharge: 2014-09-27 | Disposition: A | Discharge status: Home / Self Care | Payer: PPO | Source: Ambulatory Visit | Attending: Internal Medicine | Admitting: Internal Medicine

## 2014-09-27 DIAGNOSIS — Z780 Asymptomatic menopausal state: Secondary | ICD-10-CM | POA: Insufficient documentation

## 2014-09-27 DIAGNOSIS — M79601 Pain in right arm: Secondary | ICD-10-CM

## 2014-09-27 DIAGNOSIS — M810 Age-related osteoporosis without current pathological fracture: Secondary | ICD-10-CM

## 2014-09-27 DIAGNOSIS — G8929 Other chronic pain: Secondary | ICD-10-CM

## 2014-09-27 DIAGNOSIS — M25611 Stiffness of right shoulder, not elsewhere classified: Secondary | ICD-10-CM

## 2014-09-27 DIAGNOSIS — M542 Cervicalgia: Secondary | ICD-10-CM

## 2014-09-27 DIAGNOSIS — R29898 Other symptoms and signs involving the musculoskeletal system: Secondary | ICD-10-CM

## 2014-09-27 NOTE — Therapy (Signed)
[place], [place], [address] Phone: [phone]   Fax:  [phone]  Physical Therapy Treatment  Patient Details  Name: Tamara Sandoval MRN: [id number] Date of Birth: 1940/06/13 Referring Provider:  Delphina Cahill, MD  Encounter Date: 09/27/2014      PT End of Session - 09/27/14 1609    Visit Number 5   Number of Visits 17   Date for PT Re-Evaluation 10/27/14   Authorization Type [employer]-G code required every 10th visit   Authorization - Visit Number 5   Authorization - Number of Visits 10   PT Start Time 1520   PT Stop Time 1357   PT Time Calculation (min) 1357 min   Activity Tolerance Patient tolerated treatment well   Behavior During Therapy [facility] for tasks assessed/performed      Past Medical History  Diagnosis Date  . Glaucoma   . Hypertension   . Neck pain   . Hypercholesteremia   . 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 Cervical spondylosis without myelopathy 07/30/2014    Past Surgical History  Procedure Laterality Date  . Total abdominal hysterectomy    . Cholecystectomy  1978    There were no vitals filed for this visit.  Visit Diagnosis:  Right arm pain  Neck pain of over 3 months duration  Decreased range of motion of neck  Decreased range of motion of right shoulder      Subjective Assessment - 09/27/14 1526    Subjective Neck is about 10% better but generally about the same really. Massage type stuff seems to help. Not feeling quite up to par today.   Pain Score 4    Pain Location Neck                         [facility] Adult PT Treatment/Exercise - 09/27/14 0001    Neck Exercises: Theraband   Scapula Retraction 10 reps;Green   Scapula Retraction Limitations heavy manual cues for correct motion, poor scapular control   Other Theraband Exercises modified retraction to sitting position without band and emphasis on motor control for retraction.    Neck Exercises:  Seated   Shoulder Shrugs 10 reps   Shoulder Shrugs Limitations shoulders up, back, down and relax   Other Seated Exercise levator stretch (nose toward axilla) bilaterally X 5 reps, 20second holds   Manual Therapy   Manual Therapy Soft tissue mobilization   Manual therapy comments bilateral UT, levator scapular, rhomboids; in seated position   Neck Exercises: Stretches   Corner Stretch 5 reps;20 seconds  cues for longer stretch with less repetitions.                PT Education - 09/27/14 1606    Education provided Yes   Education Details reinforced scapular motion and how to perform at home. focus on scapular retraction, corner stretch, levator scapulae stretch.    Person(s) Educated Patient   Methods Explanation;Demonstration;Tactile cues;Verbal cues   Comprehension Verbalized understanding;Need further instruction          PT Short Term Goals - 09/14/14 1545    PT SHORT TERM GOAL #1   Title Pt will demonstrate ability to perform cervical rotation R to 30 degrees for improved scanning environment. Target 09/28/14   Status On-going   PT SHORT TERM GOAL #2   Title Pt will perform cervical rotation left to 25 degrees for improved scanning of environment. Target 09/28/14   Status  On-going   PT SHORT TERM GOAL #3   Title Pt will demonstrate correct performance of HEP for improved neck AROM, R shoulder AROM and strength. Target 09/28/14   Status On-going   PT SHORT TERM GOAL #4   Title Pt will report or demonstrate ability to read book for >15 minutes prior to experiencing paresthesias in hands for increased quailty of life. (was 10 minutes on eval) Target 09/28/14   PT SHORT TERM GOAL #5   Title Pt will report ability to use RUE independently for unlaoding 50% of dishwasher for increased RUE strength and range of motion for IADLs. Target 09/28/14   Status On-going           PT Long Term Goals - 09/14/14 1546    PT LONG TERM GOAL #1   Title Pt will improve neck disability  index to 25% disability on FOTO NDI test. Target 10/28/14   PT LONG TERM GOAL #2   Title Pt will increase neck AROM rotation R to 35 degress for improving ability to scan environment while driving. Target 10/28/14   PT LONG TERM GOAL #3   Title Pt will incrase neck AROM rotation L to 30 degrees for improving ability to scan environemnt. Target 10/28/14   PT LONG TERM GOAL #4   Title Pt will report increased ease with looking into cupboards due to increased cervical extension and decrased pain. Target 10/28/14   PT LONG TERM GOAL #5   Title Pt will demonstrate ability to reach her mid back with the RUE to assist with bathing for improved hygiene. Target 10/28/14               Plan - 09/27/14 1610    Clinical Impression Statement Patient with very poor scapular motor control with noted compensatory shoulder extension instead of scapular retraction. Heavy verbal and tactile cues needed for true scapular retraction. Patient also to work on upper trapezius and levator scapulae stretches. Patient will need to continue to benefit from ongoing sessions to work on scapular mobility and strength.    PT Frequency 2x / week   PT Treatment/Interventions ADLs/Self Care Home Management;Patient/family education;Therapeutic exercise;Neuromuscular re-education;Manual techniques;Passive range of motion;Traction;Therapeutic activities;Manual lymph drainage   PT Next Visit Plan Recommend verbal and tactile cues for correct scapular motion, especially retraction. Gently soft tissue work per patient preference.    PT Home Exercise Plan Scapular mobilization and strengthening when able to master motor control.    Consulted and Agree with Plan of Care Patient        Problem List Patient Active Problem List   Diagnosis Date Noted  . 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 Cervical spondylosis without myelopathy 07/30/2014    Cassell Clement, PT, CSCS  09/27/2014, 4:20 PM  [facility] [address] [place], [place], [address] Phone: [phone]   Fax:  [phone]

## 2014-09-28 ENCOUNTER — Ambulatory Visit (HOSPITAL_COMMUNITY): Payer: PPO

## 2014-09-28 ENCOUNTER — Other Ambulatory Visit (HOSPITAL_COMMUNITY): Payer: PPO

## 2014-10-02 ENCOUNTER — Ambulatory Visit (HOSPITAL_COMMUNITY): Payer: PPO | Admitting: Physical Therapy

## 2014-10-02 DIAGNOSIS — R29898 Other symptoms and signs involving the musculoskeletal system: Secondary | ICD-10-CM

## 2014-10-02 DIAGNOSIS — M79601 Pain in right arm: Secondary | ICD-10-CM

## 2014-10-02 DIAGNOSIS — M542 Cervicalgia: Secondary | ICD-10-CM | POA: Diagnosis not present

## 2014-10-02 DIAGNOSIS — G8929 Other chronic pain: Secondary | ICD-10-CM

## 2014-10-02 NOTE — Therapy (Signed)
[place], [place], [address] Phone: [phone]   Fax:  [phone]  Physical Therapy Treatment  Patient Details  Name: Tamara Sandoval MRN: [id number] Date of Birth: 1940-12-09 Referring Provider:  Kathrynn Ducking, MD  Encounter Date: 10/02/2014      PT End of Session - 10/02/14 1613    Visit Number 6   Number of Visits 17   Date for PT Re-Evaluation 10/27/14   Authorization Type [employer]-G code required every 10th visit   Authorization - Visit Number 6   Authorization - Number of Visits 10   PT Start Time 1522   PT Stop Time 1608   PT Time Calculation (min) 46 min   Activity Tolerance Patient tolerated treatment well      Past Medical History  Diagnosis Date  . Glaucoma   . Hypertension   . Neck pain   . Hypercholesteremia   . 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 Cervical spondylosis without myelopathy 07/30/2014    Past Surgical History  Procedure Laterality Date  . Total abdominal hysterectomy    . Cholecystectomy  1978    There were no vitals filed for this visit.  Visit Diagnosis:  Right arm pain  Neck pain of over 3 months duration  Decreased range of motion of neck      Subjective Assessment - 10/02/14 1532    Subjective Pt reports that she does not feel much different. She is still experiencing neck and shoulder pain bilaterally and Rt arm pain down to her elbow.    Pain Score 5    Pain Location Neck                         [facility] Adult PT Treatment/Exercise - 10/02/14 0001    Neck Exercises: Seated   Cervical Isometrics Extension;Right lateral flexion;Left lateral flexion;10 reps   Neck Retraction 10 reps   W Back 10 reps   Other Seated Exercise scapular retraction x 10   Other Seated Exercise cervical extension with support x 10 reps   Neck Exercises: Supine   Neck Retraction 10 reps   Manual Therapy   Manual Therapy Soft tissue mobilization;Manual  Traction   Soft tissue mobilization bilateral upper and mid trap in seated position                PT Education - 10/02/14 1613    Education provided Yes   Education Details Pt educated on importance of maintaining proper posture.    Person(s) Educated Patient   Methods Explanation   Comprehension Verbalized understanding          PT Short Term Goals - 09/14/14 1545    PT SHORT TERM GOAL #1   Title Pt will demonstrate ability to perform cervical rotation R to 30 degrees for improved scanning environment. Target 09/28/14   Status On-going   PT SHORT TERM GOAL #2   Title Pt will perform cervical rotation left to 25 degrees for improved scanning of environment. Target 09/28/14   Status On-going   PT SHORT TERM GOAL #3   Title Pt will demonstrate correct performance of HEP for improved neck AROM, R shoulder AROM and strength. Target 09/28/14   Status On-going   PT SHORT TERM GOAL #4   Title Pt will report or demonstrate ability to read book for >15 minutes prior to experiencing paresthesias in hands for increased quailty of life. (was 10 minutes on  eval) Target 09/28/14   PT SHORT TERM GOAL #5   Title Pt will report ability to use RUE independently for unlaoding 50% of dishwasher for increased RUE strength and range of motion for IADLs. Target 09/28/14   Status On-going           PT Long Term Goals - 09/14/14 1546    PT LONG TERM GOAL #1   Title Pt will improve neck disability index to 25% disability on FOTO NDI test. Target 10/28/14   PT LONG TERM GOAL #2   Title Pt will increase neck AROM rotation R to 35 degress for improving ability to scan environment while driving. Target 10/28/14   PT LONG TERM GOAL #3   Title Pt will incrase neck AROM rotation L to 30 degrees for improving ability to scan environemnt. Target 10/28/14   PT LONG TERM GOAL #4   Title Pt will report increased ease with looking into cupboards due to increased cervical extension and decrased pain. Target  10/28/14   PT LONG TERM GOAL #5   Title Pt will demonstrate ability to reach her mid back with the RUE to assist with bathing for improved hygiene. Target 10/28/14               Plan - 10/02/14 1614    Clinical Impression Statement Pt with marked muscle spasms bilateral trap. Pt educated on the importance of activating scapular musculature with posture. Pt verbalizes decreased pain at end of session.   PT Next Visit Plan If pt's pain does not decrease, may try trial of cervical traction.         Problem List Patient Active Problem List   Diagnosis Date Noted  . 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 Cervical spondylosis without myelopathy 07/30/2014   Tamara Sandoval, PT CLT [phone] 10/02/2014, 4:18 PM  [facility] [address] [place], [place], [address] Phone: [phone]   Fax:  [phone]

## 2014-10-03 ENCOUNTER — Ambulatory Visit (HOSPITAL_COMMUNITY): Payer: PPO

## 2014-10-03 DIAGNOSIS — M79601 Pain in right arm: Secondary | ICD-10-CM

## 2014-10-03 DIAGNOSIS — M542 Cervicalgia: Secondary | ICD-10-CM

## 2014-10-03 DIAGNOSIS — M25611 Stiffness of right shoulder, not elsewhere classified: Secondary | ICD-10-CM

## 2014-10-03 DIAGNOSIS — R29898 Other symptoms and signs involving the musculoskeletal system: Secondary | ICD-10-CM

## 2014-10-03 DIAGNOSIS — G8929 Other chronic pain: Secondary | ICD-10-CM

## 2014-10-03 NOTE — Therapy (Signed)
[place], [place], [address] Phone: [phone]   Fax:  [phone]  Physical Therapy Treatment  Patient Details  Name: GRACIANA SESSA MRN: [id number] Date of Birth: 1940/12/13 Referring Provider:  Delphina Cahill, MD  Encounter Date: 10/03/2014      PT End of Session - 10/03/14 1745    Visit Number 7   Number of Visits 17   Date for PT Re-Evaluation 10/27/14   Authorization Type [employer]-G code required every 10th visit   Authorization - Visit Number 7   Authorization - Number of Visits 10   PT Start Time 2426   PT Stop Time 1820   PT Time Calculation (min) 42 min   Activity Tolerance Patient tolerated treatment well   Behavior During Therapy [facility] for tasks assessed/performed      Past Medical History  Diagnosis Date  . Glaucoma   . Hypertension   . Neck pain   . Hypercholesteremia   . 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 Cervical spondylosis without myelopathy 07/30/2014    Past Surgical History  Procedure Laterality Date  . Total abdominal hysterectomy    . Cholecystectomy  1978    There were no vitals filed for this visit.  Visit Diagnosis:  Right arm pain  Neck pain of over 3 months duration  Decreased range of motion of neck  Decreased range of motion of right shoulder      Subjective Assessment - 10/03/14 1740    Subjective Pt stated she doesnt feel good today, stated she feels like she may be getting the flu on and off today though does not describe any flu like symptoms.   Currently in Pain? Yes   Pain Score 5    Pain Location Neck   Pain Orientation --  Radicular symptoms down Rt arm to elbow   Pain Descriptors / Indicators Aching;Burning   Pain Radiating Towards radicular symptoms down Rt UE to elbow             [facility] Adult PT Treatment/Exercise - 10/03/14 0001    Exercises   Exercises Neck   Neck Exercises: Theraband   Rows 15 reps;Green   Neck Exercises: Seated   Neck Retraction 10 reps;5 secs   Cervical Rotation Both;10 reps   Cervical Rotation Limitations 3D cervical excursion   Lateral Flexion Both;10 reps   Lateral Flexion Limitations 3D cervical retraction   W Back 10 reps   Shoulder Rolls Backwards;10 reps   Shoulder Rolls Limitations up back and down   Other Seated Exercise scapular retraction x 10   Other Seated Exercise cervical extension with support x 10 reps   Manual Therapy   Manual Therapy Soft tissue mobilization;Manual Traction   Manual therapy comments Bilateral upper and mid traps, levator scapula and manual tractions 3x 2 min holds in supine with LE elevated                PT Education - 10/02/14 1613    Education provided Yes   Education Details Pt educated on importance of maintaining proper posture.    Person(s) Educated Patient   Methods Explanation   Comprehension Verbalized understanding          PT Short Term Goals - 10/03/14 1745    PT SHORT TERM GOAL #1   Title Pt will demonstrate ability to perform cervical rotation R to 30 degrees for improved scanning environment. Target 09/28/14   Status On-going   PT SHORT TERM  GOAL #2   Title Pt will perform cervical rotation left to 25 degrees for improved scanning of environment. Target 09/28/14   Status On-going   PT SHORT TERM GOAL #3   Title Pt will demonstrate correct performance of HEP for improved neck AROM, R shoulder AROM and strength. Target 09/28/14   Status On-going   PT SHORT TERM GOAL #4   Title Pt will report or demonstrate ability to read book for >15 minutes prior to experiencing paresthesias in hands for increased quailty of life. (was 10 minutes on eval) Target 09/28/14   PT SHORT TERM GOAL #5   Title Pt will report ability to use RUE independently for unlaoding 50% of dishwasher for increased RUE strength and range of motion for IADLs. Target 09/28/14   Status On-going           PT Long Term Goals - 10/03/14 1745    PT LONG TERM GOAL #1    Title Pt will improve neck disability index to 25% disability on FOTO NDI test. Target 10/28/14   PT LONG TERM GOAL #2   Title Pt will increase neck AROM rotation R to 35 degress for improving ability to scan environment while driving. Target 10/28/14   PT LONG TERM GOAL #3   Title Pt will incrase neck AROM rotation L to 30 degrees for improving ability to scan environemnt. Target 10/28/14   PT LONG TERM GOAL #4   Title Pt will report increased ease with looking into cupboards due to increased cervical extension and decrased pain. Target 10/28/14   PT LONG TERM GOAL #5   Title Pt will demonstrate ability to reach her mid back with the RUE to assist with bathing for improved hygiene. Target 10/28/14               Plan - 10/03/14 1816    Clinical Impression Statement Continued initial focus on imporoved cervical ROM and postural stremgthening.  Pt presented  with improve posture awareness and self cueing to improve cervical and scapular retraction.  Noted spasms Bil Upper traps, manual soft tissue mobilizaiton and position release technqiues complete to reduce tension, added manual traction with reports of radicular symptoms resolved and no pain at end of session.  Pt encouraged to stay hydrated to reduce risk of headache following manual.     PT Next Visit Plan Continue wiht current PT POC.  F/u with manual traction and may try mechanics traction next session if good results.          Problem List Patient Active Problem List   Diagnosis Date Noted  . 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 Cervical spondylosis without myelopathy 07/30/2014   Aldona Lento, PTA  Aldona Lento 10/03/2014, 6:20 PM  [place] [address] [place], [place], [address] Phone: [phone]   Fax:  [phone]

## 2014-10-04 ENCOUNTER — Ambulatory Visit (HOSPITAL_COMMUNITY)
Admission: RE | Admit: 2014-10-04 | Discharge: 2014-10-04 | Disposition: A | Discharge status: Home / Self Care | Payer: PPO | Source: Ambulatory Visit | Attending: Internal Medicine | Admitting: Internal Medicine

## 2014-10-04 ENCOUNTER — Encounter (HOSPITAL_COMMUNITY): Payer: PPO | Admitting: Physical Therapy

## 2014-10-04 DIAGNOSIS — Z1231 Encounter for screening mammogram for malignant neoplasm of breast: Secondary | ICD-10-CM | POA: Insufficient documentation

## 2014-10-09 ENCOUNTER — Ambulatory Visit (HOSPITAL_COMMUNITY): Payer: PPO | Admitting: Physical Therapy

## 2014-10-09 DIAGNOSIS — M542 Cervicalgia: Secondary | ICD-10-CM | POA: Diagnosis not present

## 2014-10-09 DIAGNOSIS — M79601 Pain in right arm: Secondary | ICD-10-CM

## 2014-10-09 DIAGNOSIS — R29898 Other symptoms and signs involving the musculoskeletal system: Secondary | ICD-10-CM

## 2014-10-09 DIAGNOSIS — M25611 Stiffness of right shoulder, not elsewhere classified: Secondary | ICD-10-CM

## 2014-10-09 DIAGNOSIS — G8929 Other chronic pain: Secondary | ICD-10-CM

## 2014-10-09 NOTE — Patient Instructions (Signed)
Scapular: Retraction (Prone)   Holding ____0 pound weights, keep arms out from sides and elbows bent. Pull elbows back, pinching shoulder blades together. Repeat _10___ times per set. Do _1___ sets per session. Do __1__ sessions per day.  http://orth.exer.us/862   Copyright  VHI. All rights reserved.  Scapular Retraction (Prone)   Lie with arms at sides. Pinch shoulder blades together and raise arms a few inches from floor. Repeat 10____ times per set. Do __1__ sets per session. Do __1__ sessions per day.  http://orth.exer.us/954   Copyright  VHI. All rights reserved.  Axial Extension   Lie on stomach with forehead resting on floor and arms at sides. Tuck chin in and raise head from floor without bending it up or down. Repeat 10____ times per set. Do __1__ sets per session. Do __1__ sessions per day.  http://orth.exer.us/970   Copyright  VHI. All rights reserved.

## 2014-10-09 NOTE — Therapy (Signed)
[place], [place], [address] Phone: [phone]   Fax:  [phone]  Physical Therapy Treatment  Patient Details  Name: Tamara Sandoval MRN: [id number] Date of Birth: Feb 24, 1941 Referring Provider:  Delphina Cahill, MD  Encounter Date: 10/09/2014      PT End of Session - 10/09/14 1612    Visit Number 8   Number of Visits 16   Date for PT Re-Evaluation 10/27/14   Authorization Type [employer]-G code required every 10th visit   Authorization - Visit Number 8   Authorization - Number of Visits 10   PT Start Time 1524   PT Stop Time 1619   PT Time Calculation (min) 55 min      Past Medical History  Diagnosis Date  . Glaucoma   . Hypertension   . Neck pain   . Hypercholesteremia   . 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 Cervical spondylosis without myelopathy 07/30/2014    Past Surgical History  Procedure Laterality Date  . Total abdominal hysterectomy    . Cholecystectomy  1978    There were no vitals filed for this visit.  Visit Diagnosis:  Right arm pain  Neck pain of over 3 months duration  Decreased range of motion of neck  Decreased range of motion of right shoulder      Subjective Assessment - 10/09/14 1523    Subjective Pt states that she can tell she is doing better.    Currently in Pain? No/denies                         [facility] Adult PT Treatment/Exercise - 10/09/14 0001    Neck Exercises: Theraband   Scapula Retraction 10 reps;Green   Shoulder Extension 10 reps;Green   Rows 10 reps;Green   Neck Exercises: Seated   Neck Retraction 10 reps;5 secs   Cervical Rotation Limitations 3D cervical excursion   W Back 10 reps   Neck Exercises: Prone   Neck Retraction 10 reps   Shoulder Extension 10 reps   Rows 10 reps   Modalities   Modalities Moist Heat;Traction   Moist Heat Therapy   Number Minutes Moist Heat 15 Minutes   Moist Heat Location Cervical   Traction   Type of Traction Cervical   Min (lbs) 15   Max (lbs) 15   Hold Time static   Time 15   Manual Therapy   Manual Therapy Soft tissue mobilization;Manual Traction   Manual therapy comments Bilateral upper and mid traps, levator scapula and manual tractions 3x 2 min holds in supine with LE elevated                PT Education - 10/09/14 1607    Education provided Yes   Education Details new Hep   Person(s) Educated Patient   Methods Explanation;Verbal cues;Handout   Comprehension Verbalized understanding;Returned demonstration          PT Short Term Goals - 10/03/14 1745    PT SHORT TERM GOAL #1   Title Pt will demonstrate ability to perform cervical rotation R to 30 degrees for improved scanning environment. Target 09/28/14   Status On-going   PT SHORT TERM GOAL #2   Title Pt will perform cervical rotation left to 25 degrees for improved scanning of environment. Target 09/28/14   Status On-going   PT SHORT TERM GOAL #3   Title Pt will demonstrate correct performance of HEP for improved neck  AROM, R shoulder AROM and strength. Target 09/28/14   Status On-going   PT SHORT TERM GOAL #4   Title Pt will report or demonstrate ability to read book for >15 minutes prior to experiencing paresthesias in hands for increased quailty of life. (was 10 minutes on eval) Target 09/28/14   PT SHORT TERM GOAL #5   Title Pt will report ability to use RUE independently for unlaoding 50% of dishwasher for increased RUE strength and range of motion for IADLs. Target 09/28/14   Status On-going           PT Long Term Goals - 10/03/14 1745    PT LONG TERM GOAL #1   Title Pt will improve neck disability index to 25% disability on FOTO NDI test. Target 10/28/14   PT LONG TERM GOAL #2   Title Pt will increase neck AROM rotation R to 35 degress for improving ability to scan environment while driving. Target 10/28/14   PT LONG TERM GOAL #3   Title Pt will incrase neck AROM rotation L to 30 degrees  for improving ability to scan environemnt. Target 10/28/14   PT LONG TERM GOAL #4   Title Pt will report increased ease with looking into cupboards due to increased cervical extension and decrased pain. Target 10/28/14   PT LONG TERM GOAL #5   Title Pt will demonstrate ability to reach her mid back with the RUE to assist with bathing for improved hygiene. Target 10/28/14               Plan - 10/09/14 1609    Clinical Impression Statement Pt with decreased spasm B although still present.  Added cervical tx as manual is decreasing pt sx significantly.  Pt with noted increased ROM with todays exercises. Pt given T-band for home use.    PT Next Visit Plan reassess in two visits for G codes and to see if tx is still needed         Problem List Patient Active Problem List   Diagnosis Date Noted  . 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 Cervical spondylosis without myelopathy 07/30/2014   Rayetta Humphrey, PT CLT [phone] 10/09/2014, 4:14 PM  [place] [address] [place], [place], [address] Phone: [phone]   Fax:  [phone]

## 2014-10-11 ENCOUNTER — Ambulatory Visit (HOSPITAL_COMMUNITY): Payer: PPO

## 2014-10-11 DIAGNOSIS — M542 Cervicalgia: Secondary | ICD-10-CM | POA: Diagnosis not present

## 2014-10-11 DIAGNOSIS — M25611 Stiffness of right shoulder, not elsewhere classified: Secondary | ICD-10-CM

## 2014-10-11 DIAGNOSIS — G8929 Other chronic pain: Secondary | ICD-10-CM

## 2014-10-11 DIAGNOSIS — R29898 Other symptoms and signs involving the musculoskeletal system: Secondary | ICD-10-CM

## 2014-10-11 DIAGNOSIS — M79601 Pain in right arm: Secondary | ICD-10-CM

## 2014-10-11 NOTE — Therapy (Signed)
[place], [place], [address] Phone: [phone]   Fax:  [phone]  Physical Therapy Treatment  Patient Details  Name: Tamara Sandoval MRN: [id number] Date of Birth: 1940-05-13 Referring Provider:  Delphina Cahill, MD  Encounter Date: 10/11/2014      PT End of Session - 10/11/14 1615    Visit Number 9   Number of Visits 16   Date for PT Re-Evaluation 10/27/14   Authorization Type [employer]-G code required every 10th visit   Authorization - Visit Number 9   Authorization - Number of Visits 10   PT Start Time 7824   PT Stop Time 1607   PT Time Calculation (min) 40 min   Activity Tolerance Patient tolerated treatment well   Behavior During Therapy [facility] for tasks assessed/performed      Past Medical History  Diagnosis Date  . Glaucoma   . Hypertension   . Neck pain   . Hypercholesteremia   . 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 Cervical spondylosis without myelopathy 07/30/2014    Past Surgical History  Procedure Laterality Date  . Total abdominal hysterectomy    . Cholecystectomy  1978    There were no vitals filed for this visit.  Visit Diagnosis:  Right arm pain  Neck pain of over 3 months duration  Decreased range of motion of neck  Decreased range of motion of right shoulder      Subjective Assessment - 10/11/14 1531    Subjective Pt reports that she has not been very compliant with HEP, but feels as though she is making some progress.   Patient Stated Goals decrease neck and arm pain, increase strength of arm, and increasing the range of motion of her neck    Currently in Pain? Yes   Pain Score 4    Pain Location Neck   Pain Orientation Left   Pain Descriptors / Indicators Aching;Burning   Pain Type Chronic pain   Pain Radiating Towards Into the R shoulder only.    Pain Frequency Constant                         [facility] Adult PT Treatment/Exercise - 10/11/14 1533    Neck Exercises: Seated   Neck Retraction 10 reps;5 secs   Neck Exercises: Prone   Neck Retraction 15 reps  seated; retraction + extension   Manual Therapy   Joint Mobilization PA glides Grade IV x 30 sec  T4, T3, T2   Myofascial Release 5 minutes release on each of the following:  Rt UT, Lt, UT, R splenius capitus, Rt/Lrt scalenes   Neck Exercises: Stretches   Upper Trapezius Stretch 30 seconds;2 reps  supine, assisted   Other Neck Stretches Rt/Lt Scalenes  2x30sec bilat                PT Education - 10/11/14 1615    Education provided Yes   Education Details Please continue with HEP daily.    Person(s) Educated Patient   Methods Explanation   Comprehension Verbalized understanding          PT Short Term Goals - 10/03/14 1745    PT SHORT TERM GOAL #1   Title Pt will demonstrate ability to perform cervical rotation R to 30 degrees for improved scanning environment. Target 09/28/14   Status On-going   PT SHORT TERM GOAL #2   Title Pt will perform cervical rotation left to 25 degrees  for improved scanning of environment. Target 09/28/14   Status On-going   PT SHORT TERM GOAL #3   Title Pt will demonstrate correct performance of HEP for improved neck AROM, R shoulder AROM and strength. Target 09/28/14   Status On-going   PT SHORT TERM GOAL #4   Title Pt will report or demonstrate ability to read book for >15 minutes prior to experiencing paresthesias in hands for increased quailty of life. (was 10 minutes on eval) Target 09/28/14   PT SHORT TERM GOAL #5   Title Pt will report ability to use RUE independently for unlaoding 50% of dishwasher for increased RUE strength and range of motion for IADLs. Target 09/28/14   Status On-going           PT Long Term Goals - 10/03/14 1745    PT LONG TERM GOAL #1   Title Pt will improve neck disability index to 25% disability on FOTO NDI test. Target 10/28/14   PT LONG TERM GOAL #2   Title Pt will increase neck AROM rotation R to  35 degress for improving ability to scan environment while driving. Target 10/28/14   PT LONG TERM GOAL #3   Title Pt will incrase neck AROM rotation L to 30 degrees for improving ability to scan environemnt. Target 10/28/14   PT LONG TERM GOAL #4   Title Pt will report increased ease with looking into cupboards due to increased cervical extension and decrased pain. Target 10/28/14   PT LONG TERM GOAL #5   Title Pt will demonstrate ability to reach her mid back with the RUE to assist with bathing for improved hygiene. Target 10/28/14               Plan - 10/11/14 1618    Clinical Impression Statement Pt tolerating treatment session well, motivated and able to complete entire PT session as planned. Pt encouraged to improve compliac ewith HEP. Pt continues to make progress toward goals as evidenced by improved pain management and decreased distribution of radiculopathy. Pt's greatest limitation continues to moderate muscle tightness in bilat neck/shoulder muscles which continues to cause chronic neck pain adn HA. Patient presenting with impairment of strength, pain, range of motion, balance, and activity tolerance, limiting ability to perform ADL and mobility tasks at  baseline level of function. Patient will benefit from skilled intervention to address the above impairments and limitations, in order to restore to prior level of function, improve patient safety upon discharge, and to improve quality of life.    Pt will benefit from skilled therapeutic intervention in order to improve on the following deficits Pain;Increased edema;Impaired flexibility;Decreased strength;Postural dysfunction;Decreased range of motion;Impaired UE functional use   Rehab Potential Good   PT Frequency 2x / week   PT Duration 8 weeks   PT Treatment/Interventions ADLs/Self Care Home Management;Patient/family education;Therapeutic exercise;Neuromuscular re-education;Manual techniques;Passive range of  motion;Traction;Therapeutic activities;Manual lymph drainage   PT Next Visit Plan reassess in one visit for G codes and to see if tx is still needed    PT Home Exercise Plan Scapular mobilization and strengthening when able to master motor control.    Consulted and Agree with Plan of Care Patient        Problem List Patient Active Problem List   Diagnosis Date Noted  . 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 Cervical spondylosis without myelopathy 07/30/2014    Londen Bok C 10/11/2014, 4:21 PM  4:21 PM  Etta Grandchild, PT, DPT [place] License # [id number]  [place], [place], [address] Phone: [phone]   Fax:  [phone]

## 2014-10-16 ENCOUNTER — Ambulatory Visit (HOSPITAL_COMMUNITY): Payer: PPO | Admitting: Physical Therapy

## 2014-10-16 DIAGNOSIS — M25611 Stiffness of right shoulder, not elsewhere classified: Secondary | ICD-10-CM

## 2014-10-16 DIAGNOSIS — M542 Cervicalgia: Secondary | ICD-10-CM

## 2014-10-16 DIAGNOSIS — R29898 Other symptoms and signs involving the musculoskeletal system: Secondary | ICD-10-CM

## 2014-10-16 DIAGNOSIS — G8929 Other chronic pain: Secondary | ICD-10-CM

## 2014-10-16 DIAGNOSIS — M79601 Pain in right arm: Secondary | ICD-10-CM

## 2014-10-16 NOTE — Patient Instructions (Signed)
Flexibility: Upper Trapezius Stretch   Gently grasp right side of head while reaching behind back with other hand. Tilt head away until a gentle stretch is felt. Hold _30___ seconds. Repeat __5__ times per set. Do _1___ sets per session. Do _2___ sessions per day.  http://orth.exer.us/340   Copyright  VHI. All rights reserved.  Flexibility: Neck Stretch   Grasp left arm above wrist and pull down across body while gently tilting head same direction. Hold _20___ seconds. Relax. Repeat _5___ times per set. Do _1___ sets per session. Do _2___ sessions per day.  http://orth.exer.us/346   Copyright  VHI. All rights reserved.  Levator Scapula Stretch   Place left hand on same side shoulder blade. With other hand, gently stretch head down and away. Hold _20___ seconds. Repeat ___5_ times per set. Do __1__ sets per session. Do __2__ sessions per day.  http://orth.exer.us/348   Copyright  VHI. All rights reserved.  Scapular: Retraction (Prone)   Holding _2___ pound weights, keep arms out from sides and elbows bent. Pull elbows back, pinching shoulder blades together. Repeat _10___ times per set. Do _1___ sets per session. Do __2__ sessions per day.  http://orth.exer.us/862   Copyright  VHI. All rights reserved.  Scapular Retraction (Prone)   Lie with arms at sides. Pinch shoulder blades together and raise arms a few inches from floor. Repeat _10___ times per set. Do _1___ sets per session. Do __2__ sessions per day.  http://orth.exer.us/954   Copyright  VHI. All rights reserved.

## 2014-10-16 NOTE — Therapy (Addendum)
[facility] [address] [place], [place], [address] Phone: [phone]   Fax:  [phone]  Physical Therapy Treatment  Patient Details  Name: Tamara Sandoval MRN: [id number] Date of Birth: 04/12/1941 Referring Provider:  Hall, Zach, MD  Encounter Date: 10/16/2014      PT End of Session - 10/16/14 1612    Visit Number 10   Number of Visits 16   Date for PT Re-Evaluation 10/27/14   Authorization Type [employer]-G code required every 10th visit   Authorization - Visit Number 10   Authorization - Number of Visits 16   PT Start Time 1527   PT Stop Time 1611   PT Time Calculation (min) 44 min   Activity Tolerance Patient tolerated treatment well      Past Medical History  Diagnosis Date  . Glaucoma   . Hypertension   . Neck pain   . Hypercholesteremia   . 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 Cervical spondylosis without myelopathy 07/30/2014    Past Surgical History  Procedure Laterality Date  . Total abdominal hysterectomy    . Cholecystectomy  1978    There were no vitals filed for this visit.  Visit Diagnosis:  Right arm pain  Neck pain of over 3 months duration  Decreased range of motion of neck  Decreased range of motion of right shoulder      Subjective Assessment - 10/16/14 1528    Subjective Pt states that she has more pain on her Lt side than her Rt.  The pain goes to her ear.    Pain Score 3    Pain Location Neck   Pain Orientation Left   Pain Descriptors / Indicators Throbbing   Pain Type Chronic pain            [facility] PT Assessment - 10/16/14 0001    Assessment   Medical Diagnosis Cervical Spondylosis M47.812   Onset Date/Surgical Date --  1 year ago   Prior Therapy L shoulder replacement 3 years ago   Precautions   Precautions None   Home Environment   Living Environment Private residence   Living Arrangements Spouse/significant other   Available Help at Discharge Family   Type of Home  House   Home Access Stairs to enter   Entrance Stairs-Number of Steps 4   Entrance Stairs-Rails Left   Home Layout Two level   Alternate Level Stairs-Number of Steps 14   Prior Function   Level of Independence Independent with gait;Independent with transfers  currently independent   Leisure reading, sewing/quilting,    Observation/Other Assessments   Focus on Therapeutic Outcomes (FOTO)  Functional Status18   Other Surveys  --  Neck Disability Index (NDI) 40% now 34%    Posture/Postural Control   Posture/Postural Control Postural limitations   Postural Limitations Forward head;Posterior pelvic tilt   AROM   AROM Assessment Site Cervical   Cervical Flexion wfl   Cervical Extension wfl   Cervical - Right Side Bend decresed 20%   Cervical - Left Side Bend decreased 20%   Cervical - Right Rotation decreased 10%   Cervical - Left Rotation decreased 205    Strength   Strength Assessment Site Cervical   Cervical Extension 5/5   Cervical - Right Side Bend 4+/5   Cervical - Left Side Bend 4/5   Palpation   Palpation comment continues to have moderate spasms.                        [place] [facility] PT Treatment/Exercise - 10/16/14 0001    Neck Exercises: Seated   Other Seated Exercise --   Neck Exercises: Prone   Neck Retraction 15 reps  seated; retraction + extension   Shoulder Extension 10 reps   Shoulder Extension Weights (lbs) 2   Rows 10 reps   Rows Weights (lbs) 2   Manual Therapy   Manual Therapy Soft tissue mobilization;Manual Traction   Manual therapy comments Bilateral upper and mid traps, levator scapula and manual tractions 3x 2 min holds in supine with LE elevated   Neck Exercises: Stretches   Upper Trapezius Stretch 5 reps;30 seconds   Levator Stretch 5 reps;20 seconds   Neck Stretch 5 reps;20 seconds                PT Education - 10/16/14 1551    Education provided Yes   Education Details new HEP   Person(s) Educated Patient   Methods  Explanation;Demonstration;Handout   Comprehension Verbalized understanding;Returned demonstration          PT Short Term Goals - 10/16/14 1530    PT SHORT TERM GOAL #1   Title Pt will demonstrate ability to perform cervical rotation R to 30 degrees for improved scanning environment. Target 09/28/14   Period Weeks   Status Achieved   PT SHORT TERM GOAL #2   Title Pt will perform cervical rotation left to 25 degrees for improved scanning of environment. Target 09/28/14   Period Weeks   Status Achieved   PT SHORT TERM GOAL #3   Title Pt will demonstrate correct performance of HEP for improved neck AROM, R shoulder AROM and strength. Target 09/28/14   Status Partially Met   PT SHORT TERM GOAL #4   Title Pt will report or demonstrate ability to read book for >15 minutes prior to experiencing paresthesias in hands for increased quailty of life. (was 10 minutes on eval) Target 09/28/14   Period Weeks   Status On-going   PT SHORT TERM GOAL #5   Title Pt will report ability to use RUE independently for unlaoding 50% of dishwasher for increased RUE strength and range of motion for IADLs. Target 09/28/14   Status Achieved           PT Long Term Goals - 10/16/14 1541    PT LONG TERM GOAL #1   Title Pt will improve neck disability index to 25% disability on FOTO NDI test. Target 10/28/14   PT LONG TERM GOAL #2   Title Pt will increase neck AROM rotation R to 35 degress for improving ability to scan environment while driving. Target 10/28/14   Status Achieved   PT LONG TERM GOAL #3   Title Pt will incrase neck AROM rotation L to 30 degrees for improving ability to scan environemnt. Target 10/28/14   Period Weeks   Status Achieved   PT LONG TERM GOAL #4   Title Pt will report increased ease with looking into cupboards due to increased cervical extension and decrased pain. Target 10/28/14   Status Achieved   PT LONG TERM GOAL #5   Title Pt will demonstrate ability to reach her mid back with the  RUE to assist with bathing for improved hygiene. Target 10/28/14   Status On-going               Plan - 10/16/14 1612    Clinical Impression Statement Pt has improved in all aspects but still has cervical and Rt UE ROM limitations.  Pt  mm spasms have decreased to mild.  Will concentrate on imiproving UE functiion and endurance.    Pt will benefit from skilled therapeutic intervention in order to improve on the following deficits Pain;Increased edema;Impaired flexibility;Decreased strength;Postural dysfunction;Decreased range of motion;Impaired UE functional use   Rehab Potential Good   PT Frequency 2x / week   PT Duration 8 weeks   PT Treatment/Interventions ADLs/Self Care Home Management;Patient/family education;Therapeutic exercise;Neuromuscular re-education;Manual techniques;Passive range of motion;Traction;Therapeutic activities;Manual lymph drainage   PT Next Visit Plan begin wall wash, stabilizing cervical with B UE flexionwith 1#; and UBE           G-Codes - 10/16/14 1615    Functional Assessment Tool Used Neck Disability Index (FOTO) 34%   Functional Limitation Changing and maintaining body position   Changing and Maintaining Body Position Current Status (G8981) At least 20 percent but less than 40 percent impaired, limited or restricted   Changing and Maintaining Body Position Goal Status (G8982) At least 20 percent but less than 40 percent impaired, limited or restricted      Problem List Patient Active Problem List   Diagnosis Date Noted  . 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 Cervical spondylosis without myelopathy 07/30/2014   Cynthia Russell, PT CLT [phone] 10/16/2014, 4:16 PM PHYSICAL THERAPY DISCHARGE SUMMARY  Visits from Start of Care: 10  Current functional level related to goals / functional outcomes: As above   Remaining deficits: As above   Education / Equipment: HEP  Plan: Patient agrees to discharge.  Patient goals were partially met. Patient  is being discharged due to not returning since the last visit.  ?????  Cynthia Russell, PT CLT [phone]     [facility] [address] [place], [place], [address] Phone: [phone]   Fax:  [phone]      

## 2014-10-18 ENCOUNTER — Encounter (HOSPITAL_COMMUNITY): Payer: PPO | Admitting: Physical Therapy

## 2014-10-23 ENCOUNTER — Encounter (HOSPITAL_COMMUNITY): Payer: PPO | Admitting: Physical Therapy

## 2014-10-25 ENCOUNTER — Encounter (HOSPITAL_COMMUNITY): Payer: PPO

## 2014-10-30 ENCOUNTER — Ambulatory Visit (HOSPITAL_COMMUNITY): Payer: PPO | Admitting: Physical Therapy

## 2014-11-01 ENCOUNTER — Ambulatory Visit (HOSPITAL_COMMUNITY): Payer: PPO | Attending: Neurology | Admitting: Physical Therapy

## 2014-11-01 ENCOUNTER — Ambulatory Visit: Payer: PPO | Admitting: Nurse Practitioner

## 2014-11-08 ENCOUNTER — Ambulatory Visit: Payer: PPO | Admitting: Nurse Practitioner

## 2015-02-27 NOTE — Telephone Encounter (Signed)
No show, called to indicate DC due to 2 no shows in a row per policy. Pt arrived just after message complete, not [place], [place]; [place] [phone]

## 2015-05-29 DIAGNOSIS — H02054 Trichiasis without entropian left upper eyelid: Secondary | ICD-10-CM | POA: Diagnosis not present

## 2015-05-29 DIAGNOSIS — H4322 Crystalline deposits in vitreous body, left eye: Secondary | ICD-10-CM | POA: Diagnosis not present

## 2015-05-29 DIAGNOSIS — H2513 Age-related nuclear cataract, bilateral: Secondary | ICD-10-CM | POA: Diagnosis not present

## 2015-05-29 DIAGNOSIS — H401132 Primary open-angle glaucoma, bilateral, moderate stage: Secondary | ICD-10-CM | POA: Diagnosis not present

## 2015-06-07 DIAGNOSIS — I10 Essential (primary) hypertension: Secondary | ICD-10-CM | POA: Diagnosis not present

## 2015-06-07 DIAGNOSIS — E039 Hypothyroidism, unspecified: Secondary | ICD-10-CM | POA: Diagnosis not present

## 2015-06-12 DIAGNOSIS — K219 Gastro-esophageal reflux disease without esophagitis: Secondary | ICD-10-CM | POA: Diagnosis not present

## 2015-06-12 DIAGNOSIS — I10 Essential (primary) hypertension: Secondary | ICD-10-CM | POA: Diagnosis not present

## 2015-06-12 DIAGNOSIS — R1084 Generalized abdominal pain: Secondary | ICD-10-CM | POA: Diagnosis not present

## 2015-06-12 DIAGNOSIS — E782 Mixed hyperlipidemia: Secondary | ICD-10-CM | POA: Diagnosis not present

## 2015-06-12 DIAGNOSIS — E039 Hypothyroidism, unspecified: Secondary | ICD-10-CM | POA: Diagnosis not present

## 2015-07-05 DIAGNOSIS — R1084 Generalized abdominal pain: Secondary | ICD-10-CM | POA: Diagnosis not present

## 2015-07-08 ENCOUNTER — Encounter: Payer: Self-pay | Admitting: Internal Medicine

## 2015-07-16 ENCOUNTER — Encounter: Payer: Self-pay | Admitting: Gastroenterology

## 2015-07-16 ENCOUNTER — Other Ambulatory Visit: Payer: Self-pay

## 2015-07-16 ENCOUNTER — Ambulatory Visit (INDEPENDENT_AMBULATORY_CARE_PROVIDER_SITE_OTHER): Payer: PPO | Admitting: Gastroenterology

## 2015-07-16 VITALS — BP 131/70 | HR 64 | Temp 97.5°F | Ht 64.0 in | Wt 163.6 lb

## 2015-07-16 DIAGNOSIS — R1013 Epigastric pain: Secondary | ICD-10-CM | POA: Diagnosis not present

## 2015-07-16 NOTE — Patient Instructions (Signed)
We have scheduled you for an upper endoscopy with Dr. Gala Romney in the near future.  Stop Prilosec. Start taking Dexilant samples once each morning.   Further recommendations to follow!

## 2015-07-16 NOTE — Progress Notes (Signed)
Primary Care Physician:  Wende Neighbors, MD Primary Gastroenterologist:  Dr. Gala Romney   Chief Complaint  Patient presents with  . Nausea  . Abdominal Pain  . Excessive Sweating  . Diarrhea    HPI:   Tamara Sandoval is a 75 y.o. female presenting today at the request of her PCP secondary to abdominal pain.   She starts by saying she is quite nauseated. Believes symptoms started about 6 months ago and have progressively worsened. Just feels "sick". Hurts in esophagus all the way down to her upper abdomen. Has little spells where she gets very hot and nauseated, almost wanting to pass out. Sometimes hurting after eating, sometimes feeling better. No vomiting. No dysphagia. Tried Protonix but without improvement. States when she was in her 74s, she was told she may have ulcers. Now on Prilosec 40 mg daily. Was taking Aleve if really needed something but "not one that takes something I don't have to have". No weight loss. Has more softer stool. Gallbladder removed in 1978. Lots of gas, moreso recently. [place] stool scale #5-6.  Started on a probiotic, yogurt this week. Will go at least 3 times after breakfast. Sometimes worse after eating out. This is her baseline. Eats at home a lot to avoid looser stool. No rectal bleeding. NO diarrhea.   Past Medical History  Diagnosis Date  . Glaucoma   . Hypertension   . Neck pain   . Hypercholesteremia   . 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 Cervical spondylosis without myelopathy 07/30/2014  . Hypothyroid     Past Surgical History  Procedure Laterality Date  . Total abdominal hysterectomy    . Cholecystectomy  1978  . Colonoscopy  2012    Dr. Charlesetta Garibaldi in [place]: sigmoid colon diverticula    Current Outpatient Prescriptions  Medication Sig Dispense Refill  . estradiol (ESTRACE) 1 MG tablet Take 1 mg by mouth daily.    Marland Kitchen LATANOPROST OP Apply 1 drop to eye daily.    Marland Kitchen levothyroxine (SYNTHROID, LEVOTHROID) 100 MCG tablet Take 100 mcg by mouth daily  before breakfast.    . lisinopril (PRINIVIL,ZESTRIL) 10 MG tablet Take 10 mg by mouth daily.    Marland Kitchen LUMIGAN 0.01 % SOLN     . omeprazole (PRILOSEC) 40 MG capsule Take 40 mg by mouth daily.    . potassium gluconate 595 MG TABS tablet Take 595 mg by mouth daily.    . sucralfate (CARAFATE) 1 g tablet     . triamterene-hydrochlorothiazide (DYAZIDE) 37.5-25 MG per capsule Take 1 capsule by mouth daily.     No current facility-administered medications for this visit.    Allergies as of 07/16/2015 - Review Complete 07/16/2015  Allergen Reaction Noted  . Bactrim [sulfamethoxazole-trimethoprim] Rash 07/30/2014    Family History  Problem Relation Age of Onset  . Pneumonia Mother   . Tuberculosis Mother   . Hypertension Sister   . Cancer Brother   . Hypertension Maternal Aunt   . Dementia Paternal Aunt   . Cancer Cousin     ovarian  . Hypercholesterolemia Cousin   . Hypertension Sister   . Colon cancer Neg Hx     Social History   Social History  . Marital Status: Married    Spouse Name: N/A  . Number of Children: 2  . Years of Education: doctorate   Occupational History  . retired     Quarry manager   Social History Main Topics  . Smoking status: Never Smoker   .  Smokeless tobacco: Never Used  . Alcohol Use: No  . Drug Use: No  . Sexual Activity: Not on file   Other Topics Concern  . Not on file   Social History Narrative   Patient is right handed.   Patient drinks 2-3 cups of caffeine daily.    Review of Systems: Gen: Denies any fever, chills, fatigue, weight loss, lack of appetite.  CV: Denies chest pain, heart palpitations, peripheral edema, syncope.  Resp: Denies shortness of breath at rest or with exertion. Denies wheezing or cough.  GI: see HPI  GU : Denies urinary burning, urinary frequency, urinary hesitancy MS: Denies joint pain, muscle weakness, cramps, or limitation of movement.  Derm: Denies rash, itching, dry skin Psych: +anxiety  Heme: Denies bruising,  bleeding, and enlarged lymph nodes.  Physical Exam: BP 131/70 mmHg  Pulse 64  Temp(Src) 97.5 F (36.4 C)  Ht 5\' 4"  (1.626 m)  Wt 163 lb 9.6 oz (74.208 kg)  BMI 28.07 kg/m2 General:   Alert and oriented. Pleasant and cooperative. Well-nourished and well-developed.  Head:  Normocephalic and atraumatic. Eyes:  Without icterus, sclera clear and conjunctiva pink.  Ears:  Normal auditory acuity. Nose:  No deformity, discharge,  or lesions. Mouth:  No deformity or lesions, oral mucosa pink.  Lungs:  Clear to auscultation bilaterally. No wheezes, rales, or rhonchi. No distress.  Heart:  S1, S2 present without murmurs appreciated.  Abdomen:  +BS, soft, non-tender and non-distended. No HSM noted. No guarding or rebound. No masses appreciated.  Rectal:  Deferred  Msk:  Symmetrical without gross deformities. Normal posture. Extremities:  Without edema. Neurologic:  Alert and  oriented x4;  grossly normal neurologically. Psych:  Alert and cooperative. Normal mood and affect.

## 2015-07-17 ENCOUNTER — Encounter: Payer: Self-pay | Admitting: Gastroenterology

## 2015-07-17 NOTE — Assessment & Plan Note (Signed)
75 year old female with upper abdominal pain, associated nausea, worsening over past few months. Exacerbated by eating but at times improved with food intake. No dysphagia. Protonix and Prilosec without improvement. Gallbladder absent. No prior EGD. Concern for gastritis, PUD. Less likely malignancy. No weight loss. Needs EGD for further initial evaluation.   Proceed with upper endoscopy in the near future with Dr. Gala Romney. The risks, benefits, and alternatives have been discussed in detail with patient. They have stated understanding and desire to proceed.  Stop Prilosec. Dexilant samples provided. Patient to call if improvement and prescription will be sent Consider CT if EGD benign Next colonoscopy due in 2022 if health permits

## 2015-07-18 ENCOUNTER — Ambulatory Visit (HOSPITAL_COMMUNITY)
Admission: RE | Admit: 2015-07-18 | Discharge: 2015-07-18 | Disposition: A | Discharge status: Home / Self Care | Payer: PPO | Source: Ambulatory Visit | Attending: Internal Medicine | Admitting: Internal Medicine

## 2015-07-18 ENCOUNTER — Encounter (HOSPITAL_COMMUNITY)
Admission: RE | Disposition: A | Discharge status: Home / Self Care | Payer: Self-pay | Source: Ambulatory Visit | Attending: Internal Medicine

## 2015-07-18 ENCOUNTER — Encounter (HOSPITAL_COMMUNITY): Payer: Self-pay

## 2015-07-18 DIAGNOSIS — R1013 Epigastric pain: Secondary | ICD-10-CM | POA: Insufficient documentation

## 2015-07-18 DIAGNOSIS — E7800 Pure hypercholesterolemia, unspecified: Secondary | ICD-10-CM | POA: Insufficient documentation

## 2015-07-18 DIAGNOSIS — Z79899 Other long term (current) drug therapy: Secondary | ICD-10-CM | POA: Insufficient documentation

## 2015-07-18 DIAGNOSIS — I10 Essential (primary) hypertension: Secondary | ICD-10-CM | POA: Insufficient documentation

## 2015-07-18 DIAGNOSIS — K219 Gastro-esophageal reflux disease without esophagitis: Secondary | ICD-10-CM | POA: Diagnosis not present

## 2015-07-18 DIAGNOSIS — E039 Hypothyroidism, unspecified: Secondary | ICD-10-CM | POA: Diagnosis not present

## 2015-07-18 DIAGNOSIS — K449 Diaphragmatic hernia without obstruction or gangrene: Secondary | ICD-10-CM | POA: Diagnosis not present

## 2015-07-18 DIAGNOSIS — R110 Nausea: Secondary | ICD-10-CM | POA: Diagnosis not present

## 2015-07-18 HISTORY — PX: ESOPHAGOGASTRODUODENOSCOPY: SHX5428

## 2015-07-18 SURGERY — EGD (ESOPHAGOGASTRODUODENOSCOPY)
Anesthesia: Moderate Sedation

## 2015-07-18 MED ORDER — MEPERIDINE HCL 100 MG/ML IJ SOLN
INTRAMUSCULAR | Status: AC
  Filled 2015-07-18: qty 2

## 2015-07-18 MED ORDER — MEPERIDINE HCL 100 MG/ML IJ SOLN
INTRAMUSCULAR | Status: DC | PRN
  Administered 2015-07-18: 50 mg via INTRAVENOUS

## 2015-07-18 MED ORDER — ONDANSETRON HCL 4 MG/2ML IJ SOLN
INTRAMUSCULAR | Status: DC | PRN
  Administered 2015-07-18: 4 mg via INTRAVENOUS

## 2015-07-18 MED ORDER — ONDANSETRON 4 MG PO TBDP
4.0000 mg | ORAL_TABLET | Freq: Once | ORAL | Status: AC
  Administered 2015-07-18: 4 mg via ORAL

## 2015-07-18 MED ORDER — ONDANSETRON 4 MG PO TBDP
ORAL_TABLET | ORAL | Status: AC
  Filled 2015-07-18: qty 1

## 2015-07-18 MED ORDER — ONDANSETRON HCL 4 MG/2ML IJ SOLN
INTRAMUSCULAR | Status: AC
  Filled 2015-07-18: qty 2

## 2015-07-18 MED ORDER — MIDAZOLAM HCL 5 MG/5ML IJ SOLN
INTRAMUSCULAR | Status: DC | PRN
  Administered 2015-07-18: 1 mg via INTRAVENOUS
  Administered 2015-07-18: 2 mg via INTRAVENOUS

## 2015-07-18 MED ORDER — LIDOCAINE VISCOUS 2 % MT SOLN
OROMUCOSAL | Status: DC | PRN
  Administered 2015-07-18: 3 mL via OROMUCOSAL

## 2015-07-18 MED ORDER — MIDAZOLAM HCL 5 MG/5ML IJ SOLN
INTRAMUSCULAR | Status: AC
  Filled 2015-07-18: qty 10

## 2015-07-18 MED ORDER — LIDOCAINE VISCOUS 2 % MT SOLN
OROMUCOSAL | Status: AC
  Filled 2015-07-18: qty 15

## 2015-07-18 MED ORDER — SODIUM CHLORIDE 0.9 % IV SOLN
INTRAVENOUS | Status: DC
  Administered 2015-07-18: 14:00:00 via INTRAVENOUS

## 2015-07-18 MED ORDER — STERILE WATER FOR IRRIGATION IR SOLN
Status: DC | PRN
  Administered 2015-07-18: 15:00:00

## 2015-07-18 NOTE — Op Note (Signed)
[facility] Patient Name: Tamara Sandoval Procedure Date: 07/18/2015 2:44 PM MRN: [id number] Date of Birth: 05-Jun-1940 Attending MD: Norvel Richards , MD CSN: [id number] Age: 75 Admit Type: Inpatient Procedure:                Upper GI endoscopy Indications:              Dyspepsia/GERD - patient reports recent symptoms                            already much improved after beginning Dexilant 60                            mg daily 2 days ago. Providers:                Norvel Richards, MD, Gwenlyn Fudge, RN, Georgeann Oppenheim, Technician Referring MD:              Medicines:                Midazolam 3 mg IV, Meperidine 50 mg IV, Ondansetron                            4 mg IV Complications:            No immediate complications. Estimated Blood Loss:     Estimated blood loss: none. Procedure:                Pre-Anesthesia Assessment:                           - Prior to the procedure, a History and Physical                            was performed, and patient medications and                            allergies were reviewed. The patient's tolerance of                            previous anesthesia was also reviewed. The risks                            and benefits of the procedure and the sedation                            options and risks were discussed with the patient.                            All questions were answered, and informed consent                            was obtained. Prior Anticoagulants: The patient has  taken no previous anticoagulant or antiplatelet                            agents. ASA Grade Assessment: II - A patient with                            mild systemic disease. After reviewing the risks                            and benefits, the patient was deemed in                            satisfactory condition to undergo the procedure.                           After obtaining informed consent,  the endoscope was                            passed under direct vision. Throughout the                            procedure, the patient's blood pressure, pulse, and                            oxygen saturations were monitored continuously. The                            [id number] scope was introduced through the                            mouth, and advanced to the second part of duodenum.                            The upper GI endoscopy was accomplished without                            difficulty. The patient tolerated the procedure                            well. Scope In: 3:04:42 PM Scope Out: 3:08:16 PM Total Procedure Duration: 0 hours 3 minutes 34 seconds  Findings:      The esophagus was normal.      A small hiatal hernia was present.      The second portion of the duodenum was normal. Impression:               - Normal esophagus.                           - Small hiatal hernia.                           - Normal second portion of the duodenum.                           -  No specimens collected. Moderate Sedation:      Moderate (conscious) sedation was administered by the endoscopy nurse       and supervised by the endoscopist. The following parameters were       monitored: oxygen saturation, heart rate, blood pressure, respiratory       rate, EKG, adequacy of pulmonary ventilation, and response to care.       Total physician intraservice time was 13 minutes. Recommendation:           - Patient has a contact number available for                            emergencies. The signs and symptoms of potential                            delayed complications were discussed with the                            patient. Return to normal activities tomorrow.                            Written discharge instructions were provided to the                            patient.                           - Advance diet as tolerated today.                           - Continue  present medications.                           - Use Dexilant (dexlansoprazole) 60 mg PO daily                            indefinitely.                           - Return to GI clinic in 3 months. Procedure Code(s):        --- Professional ---                           [id number], Esophagogastroduodenoscopy, flexible,                            transoral; diagnostic, including collection of                            specimen(s) by brushing or washing, when performed                            (separate procedure)                           99152, Moderate sedation services provided by the  same physician or other qualified health care                            professional performing the diagnostic or                            therapeutic service that the sedation supports,                            requiring the presence of an independent trained                            observer to assist in the monitoring of the                            patient's level of consciousness and physiological                            status; initial 15 minutes of intraservice time,                            patient age 80 years or older Diagnosis Code(s):        --- Professional ---                           K44.9, Diaphragmatic hernia without obstruction or                            gangrene                           R10.13, Epigastric pain CPT copyright 2016 American Medical Association. All rights reserved. The codes documented in this report are preliminary and upon coder review may  be revised to meet current compliance requirements. Cristopher Estimable. Poetry Cerro, MD Norvel Richards, MD 07/18/2015 3:25:57 PM This report has been signed electronically. Number of Addenda: 0

## 2015-07-18 NOTE — Progress Notes (Signed)
CC'D TO PCP °

## 2015-07-18 NOTE — Interval H&P Note (Signed)
History and Physical Interval Note:  07/18/2015 2:51 PM  Tamara Sandoval  has presented today for surgery, with the diagnosis of dyspepsia  The various methods of treatment have been discussed with the patient and family. After consideration of risks, benefits and other options for treatment, the patient has consented to  Procedure(s) with comments: ESOPHAGOGASTRODUODENOSCOPY (EGD) (N/A) - 300 as a surgical intervention .  The patient's history has been reviewed, patient examined, no change in status, stable for surgery.  I have reviewed the patient's chart and labs.  Questions were answered to the patient's satisfaction.     Manus Rudd  Patient states symptoms are already much better after 2 days worth of Dexilant. No dysphagia. Diagnostic EGD today per plan. The risks, benefits, limitations, alternatives and imponderables have been reviewed with the patient. Potential for esophageal dilation, biopsy, etc. have also been reviewed.  Questions have been answered. All parties agreeable.

## 2015-07-18 NOTE — Discharge Instructions (Signed)
EGD Discharge instructions Please read the instructions outlined below and refer to this sheet in the next few weeks. These discharge instructions provide you with general information on caring for yourself after you leave the hospital. Your doctor may also give you specific instructions. While your treatment has been planned according to the most current medical practices available, unavoidable complications occasionally occur. If you have any problems or questions after discharge, please call your doctor. ACTIVITY  You may resume your regular activity but move at a slower pace for the next 24 hours.   Take frequent rest periods for the next 24 hours.   Walking will help expel (get rid of) the air and reduce the bloated feeling in your abdomen.   No driving for 24 hours (because of the anesthesia (medicine) used during the test).   You may shower.   Do not sign any important legal documents or operate any machinery for 24 hours (because of the anesthesia used during the test).  NUTRITION  Drink plenty of fluids.   You may resume your normal diet.   Begin with a light meal and progress to your normal diet.   Avoid alcoholic beverages for 24 hours or as instructed by your caregiver.  MEDICATIONS  You may resume your normal medications unless your caregiver tells you otherwise.  WHAT YOU CAN EXPECT TODAY  You may experience abdominal discomfort such as a feeling of fullness or gas pains.  FOLLOW-UP  Your doctor will discuss the results of your test with you.  SEEK IMMEDIATE MEDICAL ATTENTION IF ANY OF THE FOLLOWING OCCUR:  Excessive nausea (feeling sick to your stomach) and/or vomiting.   Severe abdominal pain and distention (swelling).   Trouble swallowing.   Temperature over 101 F (37.8 C).   Rectal bleeding or vomiting of blood.    GERD and hiatal hernia information provided  Continue Dexilant 60 mg daily  Office visit with [place] in 3  months    Gastroesophageal Reflux Disease, Adult Normally, food travels down the esophagus and stays in the stomach to be digested. However, when a person has gastroesophageal reflux disease (GERD), food and stomach acid move back up into the esophagus. When this happens, the esophagus becomes sore and inflamed. Over time, GERD can create small holes (ulcers) in the lining of the esophagus.  CAUSES This condition is caused by a problem with the muscle between the esophagus and the stomach (lower esophageal sphincter, or LES). Normally, the LES muscle closes after food passes through the esophagus to the stomach. When the LES is weakened or abnormal, it does not close properly, and that allows food and stomach acid to go back up into the esophagus. The LES can be weakened by certain dietary substances, medicines, and medical conditions, including:  Tobacco use.  Pregnancy.  Having a hiatal hernia.  Heavy alcohol use.  Certain foods and beverages, such as coffee, chocolate, onions, and peppermint. RISK FACTORS This condition is more likely to develop in:  People who have an increased body weight.  People who have connective tissue disorders.  People who use NSAID medicines. SYMPTOMS Symptoms of this condition include:  Heartburn.  Difficult or painful swallowing.  The feeling of having a lump in the throat.  Abitter taste in the mouth.  Bad breath.  Having a large amount of saliva.  Having an upset or bloated stomach.  Belching.  Chest pain.  Shortness of breath or wheezing.  Ongoing (chronic) cough or a night-time cough.  Wearing away of  tooth enamel.  Weight loss. Different conditions can cause chest pain. Make sure to see your health care provider if you experience chest pain. DIAGNOSIS Your health care provider will take a medical history and perform a physical exam. To determine if you have mild or severe GERD, your health care provider may also monitor how  you respond to treatment. You may also have other tests, including:  An endoscopy toexamine your stomach and esophagus with a small camera.  A test thatmeasures the acidity level in your esophagus.  A test thatmeasures how much pressure is on your esophagus.  A barium swallow or modified barium swallow to show the shape, size, and functioning of your esophagus. TREATMENT The goal of treatment is to help relieve your symptoms and to prevent complications. Treatment for this condition may vary depending on how severe your symptoms are. Your health care provider may recommend:  Changes to your diet.  Medicine.  Surgery. HOME CARE INSTRUCTIONS Diet  Follow a diet as recommended by your health care provider. This may involve avoiding foods and drinks such as:  Coffee and tea (with or without caffeine).  Drinks that containalcohol.  Energy drinks and sports drinks.  Carbonated drinks or sodas.  Chocolate and cocoa.  Peppermint and mint flavorings.  Garlic and onions.  Horseradish.  Spicy and acidic foods, including peppers, chili powder, curry powder, vinegar, hot sauces, and barbecue sauce.  Citrus fruit juices and citrus fruits, such as oranges, lemons, and limes.  Tomato-based foods, such as red sauce, chili, salsa, and pizza with red sauce.  Fried and fatty foods, such as donuts, french fries, potato chips, and high-fat dressings.  High-fat meats, such as hot dogs and fatty cuts of red and white meats, such as rib eye steak, sausage, ham, and bacon.  High-fat dairy items, such as whole milk, butter, and cream cheese.  Eat small, frequent meals instead of large meals.  Avoid drinking large amounts of liquid with your meals.  Avoid eating meals during the 2-3 hours before bedtime.  Avoid lying down right after you eat.  Do not exercise right after you eat. General Instructions  Pay attention to any changes in your symptoms.  Take over-the-counter and  prescription medicines only as told by your health care provider. Do not take aspirin, ibuprofen, or other NSAIDs unless your health care provider told you to do so.  Do not use any tobacco products, including cigarettes, chewing tobacco, and e-cigarettes. If you need help quitting, ask your health care provider.  Wear loose-fitting clothing. Do not wear anything tight around your waist that causes pressure on your abdomen.  Raise (elevate) the head of your bed 6 inches (15cm).  Try to reduce your stress, such as with yoga or meditation. If you need help reducing stress, ask your health care provider.  If you are overweight, reduce your weight to an amount that is healthy for you. Ask your health care provider for guidance about a safe weight loss goal.  Keep all follow-up visits as told by your health care provider. This is important. SEEK MEDICAL CARE IF:  You have new symptoms.  You have unexplained weight loss.  You have difficulty swallowing, or it hurts to swallow.  You have wheezing or a persistent cough.  Your symptoms do not improve with treatment.  You have a hoarse voice. SEEK IMMEDIATE MEDICAL CARE IF:  You have pain in your arms, neck, jaw, teeth, or back.  You feel sweaty, dizzy, or light-headed.  You  have chest pain or shortness of breath.  You vomit and your vomit looks like blood or coffee grounds.  You faint.  Your stool is bloody or black.  You cannot swallow, drink, or eat.   This information is not intended to replace advice given to you by your health care provider. Make sure you discuss any questions you have with your health care provider.   Document Released: 01/14/2005 Document Revised: 12/26/2014 Document Reviewed: 08/01/2014 Elsevier Interactive Patient Education 2016 [employer]  Hiatal Hernia A hiatal hernia occurs when part of your stomach slides above the muscle that separates your abdomen from your chest (diaphragm). You can be born  with a hiatal hernia (congenital), or it may develop over time. In almost all cases of hiatal hernia, only the top part of the stomach pushes through.  Many people have a hiatal hernia with no symptoms. The larger the hernia, the more likely that you will have symptoms. In some cases, a hiatal hernia allows stomach acid to flow back into the tube that carries food from your mouth to your stomach (esophagus). This may cause heartburn symptoms. Severe heartburn symptoms may mean you have developed a condition called gastroesophageal reflux disease (GERD).  CAUSES  Hiatal hernias are caused by a weakness in the opening (hiatus) where your esophagus passes through your diaphragm to attach to the upper part of your stomach. You may be born with a weakness in your hiatus, or a weakness can develop. RISK FACTORS Older age is a major risk factor for a hiatal hernia. Anything that increases pressure on your diaphragm can also increase your risk of a hiatal hernia. This includes:  Pregnancy.  Excess weight.  Frequent constipation. SIGNS AND SYMPTOMS  People with a hiatal hernia often have no symptoms. If symptoms develop, they are almost always caused by GERD. They may include:  Heartburn.  Belching.  Indigestion.  Trouble swallowing.  Coughing or wheezing.  Sore throat.  Hoarseness.  Chest pain. DIAGNOSIS  A hiatal hernia is sometimes found during an exam for another problem. Your health care provider may suspect a hiatal hernia if you have symptoms of GERD. Tests may be done to diagnose GERD. These may include:  X-rays of your stomach or chest.  An upper gastrointestinal (GI) series. This is an X-ray exam of your GI tract involving the use of a chalky liquid that you swallow. The liquid shows up clearly on the X-ray.  Endoscopy. This is a procedure to look into your stomach using a thin, flexible tube that has a tiny camera and light on the end of it. TREATMENT  If you have no  symptoms, you may not need treatment. If you have symptoms, treatment may include:  Dietary and lifestyle changes to help reduce GERD symptoms.  Medicines. These may include:  Over-the-counter antacids.  Medicines that make your stomach empty more quickly.  Medicines that block the production of stomach acid (H2 blockers).  Stronger medicines to reduce stomach acid (proton pump inhibitors).  You may need surgery to repair the hernia if other treatments are not helping. HOME CARE INSTRUCTIONS   Take all medicines as directed by your health care provider.  Quit smoking, if you smoke.  Try to achieve and maintain a healthy body weight.  Eat frequent small meals instead of three large meals a day. This keeps your stomach from getting too full.  Eat slowly.  Do not lie down right after eating.  Do noteat 1-2 hours before bed.  Do not drink beverages with caffeine. These include cola, coffee, cocoa, and tea.  Do not drink alcohol.  Avoid foods that can make symptoms of GERD worse. These may include:  Fatty foods.  Citrus fruits.  Other foods and drinks that contain acid.  Avoid putting pressure on your belly. Anything that puts pressure on your belly increases the amount of acid that may be pushed up into your esophagus.   Avoid bending over, especially after eating.  Raise the head of your bed by putting blocks under the legs. This keeps your head and esophagus higher than your stomach.  Do not wear tight clothing around your chest or stomach.  Try not to strain when having a bowel movement, when urinating, or when lifting heavy objects. SEEK MEDICAL CARE IF:  Your symptoms are not controlled with medicines or lifestyle changes.  You are having trouble swallowing.  You have coughing or wheezing that will not go away. SEEK IMMEDIATE MEDICAL CARE IF:  Your pain is getting worse.  Your pain spreads to your arms, neck, jaw, teeth, or back.  You have  shortness of breath.  You sweat for no reason.  You feel sick to your stomach (nauseous) or vomit.  You vomit blood.  You have bright red blood in your stools.  You have black, tarry stools.    This information is not intended to replace advice given to you by your health care provider. Make sure you discuss any questions you have with your health care provider.   Document Released: 06/27/2003 Document Revised: 04/27/2014 Document Reviewed: 03/24/2013 Elsevier Interactive Patient Education [employer].

## 2015-07-18 NOTE — H&P (View-Only) (Signed)
Primary Care Physician:  Wende Neighbors, MD Primary Gastroenterologist:  Dr. Gala Romney   Chief Complaint  Patient presents with  . Nausea  . Abdominal Pain  . Excessive Sweating  . Diarrhea    HPI:   Tamara Sandoval is a 75 y.o. female presenting today at the request of her PCP secondary to abdominal pain.   She starts by saying she is quite nauseated. Believes symptoms started about 6 months ago and have progressively worsened. Just feels "sick". Hurts in esophagus all the way down to her upper abdomen. Has little spells where she gets very hot and nauseated, almost wanting to pass out. Sometimes hurting after eating, sometimes feeling better. No vomiting. No dysphagia. Tried Protonix but without improvement. States when she was in her 23s, she was told she may have ulcers. Now on Prilosec 40 mg daily. Was taking Aleve if really needed something but "not one that takes something I don't have to have". No weight loss. Has more softer stool. Gallbladder removed in 1978. Lots of gas, moreso recently. [place] stool scale #5-6.  Started on a probiotic, yogurt this week. Will go at least 3 times after breakfast. Sometimes worse after eating out. This is her baseline. Eats at home a lot to avoid looser stool. No rectal bleeding. NO diarrhea.   Past Medical History  Diagnosis Date  . Glaucoma   . Hypertension   . Neck pain   . Hypercholesteremia   . 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 Cervical spondylosis without myelopathy 07/30/2014  . Hypothyroid     Past Surgical History  Procedure Laterality Date  . Total abdominal hysterectomy    . Cholecystectomy  1978  . Colonoscopy  2012    Dr. Charlesetta Garibaldi in [place]: sigmoid colon diverticula    Current Outpatient Prescriptions  Medication Sig Dispense Refill  . estradiol (ESTRACE) 1 MG tablet Take 1 mg by mouth daily.    Marland Kitchen LATANOPROST OP Apply 1 drop to eye daily.    Marland Kitchen levothyroxine (SYNTHROID, LEVOTHROID) 100 MCG tablet Take 100 mcg by mouth daily  before breakfast.    . lisinopril (PRINIVIL,ZESTRIL) 10 MG tablet Take 10 mg by mouth daily.    Marland Kitchen LUMIGAN 0.01 % SOLN     . omeprazole (PRILOSEC) 40 MG capsule Take 40 mg by mouth daily.    . potassium gluconate 595 MG TABS tablet Take 595 mg by mouth daily.    . sucralfate (CARAFATE) 1 g tablet     . triamterene-hydrochlorothiazide (DYAZIDE) 37.5-25 MG per capsule Take 1 capsule by mouth daily.     No current facility-administered medications for this visit.    Allergies as of 07/16/2015 - Review Complete 07/16/2015  Allergen Reaction Noted  . Bactrim [sulfamethoxazole-trimethoprim] Rash 07/30/2014    Family History  Problem Relation Age of Onset  . Pneumonia Mother   . Tuberculosis Mother   . Hypertension Sister   . Cancer Brother   . Hypertension Maternal Aunt   . Dementia Paternal Aunt   . Cancer Cousin     ovarian  . Hypercholesterolemia Cousin   . Hypertension Sister   . Colon cancer Neg Hx     Social History   Social History  . Marital Status: Married    Spouse Name: N/A  . Number of Children: 2  . Years of Education: doctorate   Occupational History  . retired     Quarry manager   Social History Main Topics  . Smoking status: Never Smoker   .  Smokeless tobacco: Never Used  . Alcohol Use: No  . Drug Use: No  . Sexual Activity: Not on file   Other Topics Concern  . Not on file   Social History Narrative   Patient is right handed.   Patient drinks 2-3 cups of caffeine daily.    Review of Systems: Gen: Denies any fever, chills, fatigue, weight loss, lack of appetite.  CV: Denies chest pain, heart palpitations, peripheral edema, syncope.  Resp: Denies shortness of breath at rest or with exertion. Denies wheezing or cough.  GI: see HPI  GU : Denies urinary burning, urinary frequency, urinary hesitancy MS: Denies joint pain, muscle weakness, cramps, or limitation of movement.  Derm: Denies rash, itching, dry skin Psych: +anxiety  Heme: Denies bruising,  bleeding, and enlarged lymph nodes.  Physical Exam: BP 131/70 mmHg  Pulse 64  Temp(Src) 97.5 F (36.4 C)  Ht 5\' 4"  (1.626 m)  Wt 163 lb 9.6 oz (74.208 kg)  BMI 28.07 kg/m2 General:   Alert and oriented. Pleasant and cooperative. Well-nourished and well-developed.  Head:  Normocephalic and atraumatic. Eyes:  Without icterus, sclera clear and conjunctiva pink.  Ears:  Normal auditory acuity. Nose:  No deformity, discharge,  or lesions. Mouth:  No deformity or lesions, oral mucosa pink.  Lungs:  Clear to auscultation bilaterally. No wheezes, rales, or rhonchi. No distress.  Heart:  S1, S2 present without murmurs appreciated.  Abdomen:  +BS, soft, non-tender and non-distended. No HSM noted. No guarding or rebound. No masses appreciated.  Rectal:  Deferred  Msk:  Symmetrical without gross deformities. Normal posture. Extremities:  Without edema. Neurologic:  Alert and  oriented x4;  grossly normal neurologically. Psych:  Alert and cooperative. Normal mood and affect.

## 2015-07-22 ENCOUNTER — Encounter (HOSPITAL_COMMUNITY): Payer: Self-pay | Admitting: Internal Medicine

## 2015-07-23 ENCOUNTER — Telehealth: Payer: Self-pay

## 2015-07-23 ENCOUNTER — Other Ambulatory Visit: Payer: Self-pay

## 2015-07-23 NOTE — Telephone Encounter (Signed)
appt with Karna Christmas has been canceled

## 2015-07-23 NOTE — Telephone Encounter (Signed)
I rescheduled the patient to see AS on 7/10 at 230pm

## 2015-07-23 NOTE — Telephone Encounter (Signed)
Called and informed pt of date and time of appt.

## 2015-07-23 NOTE — Telephone Encounter (Signed)
Noted  

## 2015-07-23 NOTE — Telephone Encounter (Signed)
Pt called- she just had an ov with Vicente Males and a procedure done with [facility]. Per [facility] discharge instructions, pt needs to come back and see [place] in 3 months. Pt said endo gave her an appt before she left the day of her procedure. They made the appt with Deberah Castle. Pt would like this cancelled and wants an appointment with Vicente Males. Pt prefers an afternoon appt.  Routing to [employer] and [employer].

## 2015-07-24 ENCOUNTER — Ambulatory Visit: Payer: PPO | Admitting: Gastroenterology

## 2015-07-24 MED ORDER — DEXLANSOPRAZOLE 60 MG PO CPDR: 60.0000 mg | DELAYED_RELEASE_CAPSULE | Freq: Every day | ORAL | Status: AC

## 2015-09-30 DIAGNOSIS — H401132 Primary open-angle glaucoma, bilateral, moderate stage: Secondary | ICD-10-CM | POA: Diagnosis not present

## 2015-09-30 DIAGNOSIS — H4322 Crystalline deposits in vitreous body, left eye: Secondary | ICD-10-CM | POA: Diagnosis not present

## 2015-09-30 DIAGNOSIS — H2513 Age-related nuclear cataract, bilateral: Secondary | ICD-10-CM | POA: Diagnosis not present

## 2015-10-24 ENCOUNTER — Ambulatory Visit (INDEPENDENT_AMBULATORY_CARE_PROVIDER_SITE_OTHER): Payer: PPO | Admitting: Internal Medicine

## 2015-10-28 ENCOUNTER — Ambulatory Visit: Payer: PPO | Admitting: Gastroenterology

## 2016-04-01 DIAGNOSIS — Z0000 Encounter for general adult medical examination without abnormal findings: Secondary | ICD-10-CM | POA: Diagnosis not present

## 2016-05-27 DIAGNOSIS — H401132 Primary open-angle glaucoma, bilateral, moderate stage: Secondary | ICD-10-CM | POA: Diagnosis not present

## 2016-05-27 DIAGNOSIS — H2513 Age-related nuclear cataract, bilateral: Secondary | ICD-10-CM | POA: Diagnosis not present

## 2016-05-27 DIAGNOSIS — H4322 Crystalline deposits in vitreous body, left eye: Secondary | ICD-10-CM | POA: Diagnosis not present

## 2016-06-02 DIAGNOSIS — H401132 Primary open-angle glaucoma, bilateral, moderate stage: Secondary | ICD-10-CM | POA: Diagnosis not present

## 2016-06-16 DIAGNOSIS — H401132 Primary open-angle glaucoma, bilateral, moderate stage: Secondary | ICD-10-CM | POA: Diagnosis not present

## 2016-08-04 DIAGNOSIS — H4322 Crystalline deposits in vitreous body, left eye: Secondary | ICD-10-CM | POA: Diagnosis not present

## 2016-08-04 DIAGNOSIS — H2513 Age-related nuclear cataract, bilateral: Secondary | ICD-10-CM | POA: Diagnosis not present

## 2016-08-04 DIAGNOSIS — H401132 Primary open-angle glaucoma, bilateral, moderate stage: Secondary | ICD-10-CM | POA: Diagnosis not present

## 2016-09-11 DIAGNOSIS — E782 Mixed hyperlipidemia: Secondary | ICD-10-CM | POA: Diagnosis not present

## 2016-09-11 DIAGNOSIS — E039 Hypothyroidism, unspecified: Secondary | ICD-10-CM | POA: Diagnosis not present

## 2016-09-11 DIAGNOSIS — I10 Essential (primary) hypertension: Secondary | ICD-10-CM | POA: Diagnosis not present

## 2016-09-16 DIAGNOSIS — E039 Hypothyroidism, unspecified: Secondary | ICD-10-CM | POA: Diagnosis not present

## 2016-09-16 DIAGNOSIS — R609 Edema, unspecified: Secondary | ICD-10-CM | POA: Diagnosis not present

## 2016-09-16 DIAGNOSIS — I10 Essential (primary) hypertension: Secondary | ICD-10-CM | POA: Diagnosis not present

## 2016-09-16 DIAGNOSIS — K219 Gastro-esophageal reflux disease without esophagitis: Secondary | ICD-10-CM | POA: Diagnosis not present

## 2016-09-16 DIAGNOSIS — E782 Mixed hyperlipidemia: Secondary | ICD-10-CM | POA: Diagnosis not present

## 2016-09-21 DIAGNOSIS — J060 Acute laryngopharyngitis: Secondary | ICD-10-CM | POA: Diagnosis not present

## 2016-09-21 DIAGNOSIS — Z6827 Body mass index (BMI) 27.0-27.9, adult: Secondary | ICD-10-CM | POA: Diagnosis not present

## 2016-11-06 DIAGNOSIS — H2513 Age-related nuclear cataract, bilateral: Secondary | ICD-10-CM | POA: Diagnosis not present

## 2016-11-06 DIAGNOSIS — H401132 Primary open-angle glaucoma, bilateral, moderate stage: Secondary | ICD-10-CM | POA: Diagnosis not present

## 2016-11-06 DIAGNOSIS — H4322 Crystalline deposits in vitreous body, left eye: Secondary | ICD-10-CM | POA: Diagnosis not present

## 2017-03-09 DIAGNOSIS — H401132 Primary open-angle glaucoma, bilateral, moderate stage: Secondary | ICD-10-CM | POA: Diagnosis not present

## 2017-03-09 DIAGNOSIS — H4322 Crystalline deposits in vitreous body, left eye: Secondary | ICD-10-CM | POA: Diagnosis not present

## 2017-03-09 DIAGNOSIS — H2513 Age-related nuclear cataract, bilateral: Secondary | ICD-10-CM | POA: Diagnosis not present

## 2017-04-05 DIAGNOSIS — E039 Hypothyroidism, unspecified: Secondary | ICD-10-CM | POA: Diagnosis not present

## 2017-04-05 DIAGNOSIS — E782 Mixed hyperlipidemia: Secondary | ICD-10-CM | POA: Diagnosis not present

## 2017-04-05 DIAGNOSIS — I10 Essential (primary) hypertension: Secondary | ICD-10-CM | POA: Diagnosis not present

## 2017-04-07 DIAGNOSIS — R601 Generalized edema: Secondary | ICD-10-CM | POA: Diagnosis not present

## 2017-04-07 DIAGNOSIS — I10 Essential (primary) hypertension: Secondary | ICD-10-CM | POA: Diagnosis not present

## 2017-04-07 DIAGNOSIS — M25552 Pain in left hip: Secondary | ICD-10-CM | POA: Diagnosis not present

## 2017-04-07 DIAGNOSIS — K219 Gastro-esophageal reflux disease without esophagitis: Secondary | ICD-10-CM | POA: Diagnosis not present

## 2017-04-07 DIAGNOSIS — E039 Hypothyroidism, unspecified: Secondary | ICD-10-CM | POA: Diagnosis not present

## 2017-04-07 DIAGNOSIS — E782 Mixed hyperlipidemia: Secondary | ICD-10-CM | POA: Diagnosis not present

## 2017-04-07 DIAGNOSIS — Z6827 Body mass index (BMI) 27.0-27.9, adult: Secondary | ICD-10-CM | POA: Diagnosis not present

## 2017-04-07 DIAGNOSIS — Z1331 Encounter for screening for depression: Secondary | ICD-10-CM | POA: Diagnosis not present

## 2017-04-07 DIAGNOSIS — Z0001 Encounter for general adult medical examination with abnormal findings: Secondary | ICD-10-CM | POA: Diagnosis not present

## 2017-04-17 DIAGNOSIS — J029 Acute pharyngitis, unspecified: Secondary | ICD-10-CM | POA: Diagnosis not present

## 2017-04-21 DIAGNOSIS — H2513 Age-related nuclear cataract, bilateral: Secondary | ICD-10-CM | POA: Diagnosis not present

## 2017-04-21 DIAGNOSIS — H4322 Crystalline deposits in vitreous body, left eye: Secondary | ICD-10-CM | POA: Diagnosis not present

## 2017-04-21 DIAGNOSIS — H401132 Primary open-angle glaucoma, bilateral, moderate stage: Secondary | ICD-10-CM | POA: Diagnosis not present

## 2017-06-01 DIAGNOSIS — Z6827 Body mass index (BMI) 27.0-27.9, adult: Secondary | ICD-10-CM | POA: Diagnosis not present

## 2017-06-01 DIAGNOSIS — M25552 Pain in left hip: Secondary | ICD-10-CM | POA: Diagnosis not present

## 2017-06-10 DIAGNOSIS — M25552 Pain in left hip: Secondary | ICD-10-CM | POA: Diagnosis not present

## 2017-06-16 DIAGNOSIS — M25552 Pain in left hip: Secondary | ICD-10-CM | POA: Diagnosis not present

## 2017-08-24 DIAGNOSIS — H401132 Primary open-angle glaucoma, bilateral, moderate stage: Secondary | ICD-10-CM | POA: Diagnosis not present

## 2017-08-24 DIAGNOSIS — H4322 Crystalline deposits in vitreous body, left eye: Secondary | ICD-10-CM | POA: Diagnosis not present

## 2017-08-24 DIAGNOSIS — H2513 Age-related nuclear cataract, bilateral: Secondary | ICD-10-CM | POA: Diagnosis not present

## 2017-09-30 DIAGNOSIS — E782 Mixed hyperlipidemia: Secondary | ICD-10-CM | POA: Diagnosis not present

## 2017-09-30 DIAGNOSIS — E039 Hypothyroidism, unspecified: Secondary | ICD-10-CM | POA: Diagnosis not present

## 2017-10-06 DIAGNOSIS — M25552 Pain in left hip: Secondary | ICD-10-CM | POA: Diagnosis not present

## 2017-10-06 DIAGNOSIS — M545 Low back pain: Secondary | ICD-10-CM | POA: Diagnosis not present

## 2017-10-06 DIAGNOSIS — M7918 Myalgia, other site: Secondary | ICD-10-CM | POA: Diagnosis not present

## 2017-10-06 DIAGNOSIS — E039 Hypothyroidism, unspecified: Secondary | ICD-10-CM | POA: Diagnosis not present

## 2017-10-06 DIAGNOSIS — Z6828 Body mass index (BMI) 28.0-28.9, adult: Secondary | ICD-10-CM | POA: Diagnosis not present

## 2017-10-06 DIAGNOSIS — K219 Gastro-esophageal reflux disease without esophagitis: Secondary | ICD-10-CM | POA: Diagnosis not present

## 2017-10-06 DIAGNOSIS — E782 Mixed hyperlipidemia: Secondary | ICD-10-CM | POA: Diagnosis not present

## 2017-10-06 DIAGNOSIS — I10 Essential (primary) hypertension: Secondary | ICD-10-CM | POA: Diagnosis not present

## 2017-10-06 DIAGNOSIS — R600 Localized edema: Secondary | ICD-10-CM | POA: Diagnosis not present

## 2017-10-07 ENCOUNTER — Other Ambulatory Visit (HOSPITAL_COMMUNITY): Payer: Self-pay | Admitting: Adult Health Nurse Practitioner

## 2017-10-07 ENCOUNTER — Other Ambulatory Visit: Payer: Self-pay | Admitting: Internal Medicine

## 2017-10-07 ENCOUNTER — Ambulatory Visit (HOSPITAL_COMMUNITY)
Admission: RE | Admit: 2017-10-07 | Discharge: 2017-10-07 | Disposition: A | Discharge status: Home / Self Care | Payer: PPO | Source: Ambulatory Visit | Attending: Adult Health Nurse Practitioner | Admitting: Adult Health Nurse Practitioner

## 2017-10-07 ENCOUNTER — Encounter (HOSPITAL_COMMUNITY): Payer: Self-pay

## 2017-10-07 DIAGNOSIS — M545 Low back pain: Secondary | ICD-10-CM

## 2017-10-07 DIAGNOSIS — Z201 Contact with and (suspected) exposure to tuberculosis: Secondary | ICD-10-CM | POA: Diagnosis not present

## 2017-10-07 DIAGNOSIS — Z780 Asymptomatic menopausal state: Secondary | ICD-10-CM

## 2017-10-07 DIAGNOSIS — M5136 Other intervertebral disc degeneration, lumbar region: Secondary | ICD-10-CM | POA: Diagnosis not present

## 2017-10-07 DIAGNOSIS — J9811 Atelectasis: Secondary | ICD-10-CM | POA: Diagnosis not present

## 2017-10-19 DIAGNOSIS — H401132 Primary open-angle glaucoma, bilateral, moderate stage: Secondary | ICD-10-CM | POA: Diagnosis not present

## 2017-10-19 DIAGNOSIS — H2513 Age-related nuclear cataract, bilateral: Secondary | ICD-10-CM | POA: Diagnosis not present

## 2017-10-19 DIAGNOSIS — H4322 Crystalline deposits in vitreous body, left eye: Secondary | ICD-10-CM | POA: Diagnosis not present

## 2017-11-09 DIAGNOSIS — H2513 Age-related nuclear cataract, bilateral: Secondary | ICD-10-CM | POA: Diagnosis not present

## 2017-11-09 DIAGNOSIS — H401132 Primary open-angle glaucoma, bilateral, moderate stage: Secondary | ICD-10-CM | POA: Diagnosis not present

## 2017-11-09 DIAGNOSIS — H4322 Crystalline deposits in vitreous body, left eye: Secondary | ICD-10-CM | POA: Diagnosis not present

## 2017-12-01 ENCOUNTER — Ambulatory Visit (HOSPITAL_COMMUNITY)
Admission: RE | Admit: 2017-12-01 | Discharge: 2017-12-01 | Disposition: A | Discharge status: Home / Self Care | Payer: PPO | Source: Ambulatory Visit | Attending: Internal Medicine | Admitting: Internal Medicine

## 2017-12-01 DIAGNOSIS — Z780 Asymptomatic menopausal state: Secondary | ICD-10-CM | POA: Insufficient documentation

## 2017-12-01 DIAGNOSIS — Z1231 Encounter for screening mammogram for malignant neoplasm of breast: Secondary | ICD-10-CM | POA: Insufficient documentation

## 2017-12-01 DIAGNOSIS — Z13820 Encounter for screening for osteoporosis: Secondary | ICD-10-CM | POA: Diagnosis not present

## 2018-01-25 DIAGNOSIS — M25551 Pain in right hip: Secondary | ICD-10-CM | POA: Diagnosis not present

## 2018-01-25 DIAGNOSIS — Z0000 Encounter for general adult medical examination without abnormal findings: Secondary | ICD-10-CM | POA: Diagnosis not present

## 2018-01-25 DIAGNOSIS — B029 Zoster without complications: Secondary | ICD-10-CM | POA: Diagnosis not present

## 2018-04-08 DIAGNOSIS — I10 Essential (primary) hypertension: Secondary | ICD-10-CM | POA: Diagnosis not present

## 2018-04-08 DIAGNOSIS — Z6828 Body mass index (BMI) 28.0-28.9, adult: Secondary | ICD-10-CM | POA: Diagnosis not present

## 2018-04-08 DIAGNOSIS — E782 Mixed hyperlipidemia: Secondary | ICD-10-CM | POA: Diagnosis not present

## 2018-04-08 DIAGNOSIS — B029 Zoster without complications: Secondary | ICD-10-CM | POA: Diagnosis not present

## 2018-04-08 DIAGNOSIS — E039 Hypothyroidism, unspecified: Secondary | ICD-10-CM | POA: Diagnosis not present

## 2018-04-18 DIAGNOSIS — R410 Disorientation, unspecified: Secondary | ICD-10-CM | POA: Diagnosis not present

## 2018-04-18 DIAGNOSIS — R55 Syncope and collapse: Secondary | ICD-10-CM | POA: Diagnosis not present

## 2018-04-18 DIAGNOSIS — I708 Atherosclerosis of other arteries: Secondary | ICD-10-CM | POA: Diagnosis not present

## 2018-04-18 DIAGNOSIS — I6523 Occlusion and stenosis of bilateral carotid arteries: Secondary | ICD-10-CM | POA: Diagnosis not present

## 2018-04-18 DIAGNOSIS — I720 Aneurysm of carotid artery: Secondary | ICD-10-CM | POA: Diagnosis not present

## 2018-04-18 DIAGNOSIS — I10 Essential (primary) hypertension: Secondary | ICD-10-CM | POA: Diagnosis not present

## 2018-04-18 DIAGNOSIS — R52 Pain, unspecified: Secondary | ICD-10-CM | POA: Diagnosis not present

## 2018-04-18 DIAGNOSIS — R531 Weakness: Secondary | ICD-10-CM | POA: Diagnosis not present

## 2018-04-18 DIAGNOSIS — R413 Other amnesia: Secondary | ICD-10-CM | POA: Diagnosis not present

## 2018-04-18 DIAGNOSIS — H538 Other visual disturbances: Secondary | ICD-10-CM | POA: Diagnosis not present

## 2018-04-18 DIAGNOSIS — F419 Anxiety disorder, unspecified: Secondary | ICD-10-CM | POA: Diagnosis not present

## 2018-04-18 DIAGNOSIS — R9082 White matter disease, unspecified: Secondary | ICD-10-CM | POA: Diagnosis not present

## 2018-04-19 ENCOUNTER — Other Ambulatory Visit: Payer: Self-pay | Admitting: Emergency Medicine

## 2018-04-19 ENCOUNTER — Other Ambulatory Visit (HOSPITAL_COMMUNITY): Payer: Self-pay | Admitting: Emergency Medicine

## 2018-04-19 DIAGNOSIS — R413 Other amnesia: Secondary | ICD-10-CM

## 2018-04-19 DIAGNOSIS — I639 Cerebral infarction, unspecified: Secondary | ICD-10-CM

## 2018-04-22 ENCOUNTER — Ambulatory Visit (HOSPITAL_COMMUNITY): Admission: RE | Admit: 2018-04-22 | Payer: PPO | Source: Ambulatory Visit

## 2018-04-22 ENCOUNTER — Encounter (HOSPITAL_COMMUNITY): Payer: Self-pay

## 2018-04-27 ENCOUNTER — Other Ambulatory Visit: Payer: Self-pay | Admitting: Adult Health Nurse Practitioner

## 2018-04-27 DIAGNOSIS — I669 Occlusion and stenosis of unspecified cerebral artery: Secondary | ICD-10-CM

## 2018-04-27 DIAGNOSIS — J060 Acute laryngopharyngitis: Secondary | ICD-10-CM | POA: Diagnosis not present

## 2018-04-27 DIAGNOSIS — I639 Cerebral infarction, unspecified: Secondary | ICD-10-CM | POA: Diagnosis not present

## 2018-04-27 DIAGNOSIS — K219 Gastro-esophageal reflux disease without esophagitis: Secondary | ICD-10-CM | POA: Diagnosis not present

## 2018-04-27 DIAGNOSIS — I10 Essential (primary) hypertension: Secondary | ICD-10-CM | POA: Diagnosis not present

## 2018-04-27 DIAGNOSIS — E039 Hypothyroidism, unspecified: Secondary | ICD-10-CM | POA: Diagnosis not present

## 2018-04-27 DIAGNOSIS — B0223 Postherpetic polyneuropathy: Secondary | ICD-10-CM | POA: Diagnosis not present

## 2018-04-27 DIAGNOSIS — N189 Chronic kidney disease, unspecified: Secondary | ICD-10-CM | POA: Diagnosis not present

## 2018-04-27 DIAGNOSIS — E782 Mixed hyperlipidemia: Secondary | ICD-10-CM | POA: Diagnosis not present

## 2018-04-27 DIAGNOSIS — R413 Other amnesia: Secondary | ICD-10-CM | POA: Diagnosis not present

## 2018-05-02 ENCOUNTER — Ambulatory Visit (HOSPITAL_COMMUNITY)
Admission: RE | Admit: 2018-05-02 | Discharge: 2018-05-02 | Disposition: A | Discharge status: Home / Self Care | Payer: PPO | Source: Ambulatory Visit | Attending: Emergency Medicine | Admitting: Emergency Medicine

## 2018-05-02 DIAGNOSIS — D329 Benign neoplasm of meninges, unspecified: Secondary | ICD-10-CM | POA: Diagnosis not present

## 2018-05-02 DIAGNOSIS — I639 Cerebral infarction, unspecified: Secondary | ICD-10-CM | POA: Diagnosis not present

## 2018-05-02 DIAGNOSIS — R413 Other amnesia: Secondary | ICD-10-CM | POA: Diagnosis not present

## 2018-05-13 ENCOUNTER — Other Ambulatory Visit: Payer: Self-pay | Admitting: Adult Health Nurse Practitioner

## 2018-05-13 ENCOUNTER — Other Ambulatory Visit (HOSPITAL_COMMUNITY): Payer: Self-pay | Admitting: Adult Health Nurse Practitioner

## 2018-05-13 DIAGNOSIS — R55 Syncope and collapse: Secondary | ICD-10-CM

## 2018-05-18 ENCOUNTER — Ambulatory Visit (HOSPITAL_COMMUNITY)
Admission: RE | Admit: 2018-05-18 | Discharge: 2018-05-18 | Disposition: A | Discharge status: Home / Self Care | Payer: PPO | Source: Ambulatory Visit | Attending: Adult Health Nurse Practitioner | Admitting: Adult Health Nurse Practitioner

## 2018-05-18 DIAGNOSIS — R55 Syncope and collapse: Secondary | ICD-10-CM | POA: Insufficient documentation

## 2018-05-18 DIAGNOSIS — I6523 Occlusion and stenosis of bilateral carotid arteries: Secondary | ICD-10-CM | POA: Diagnosis not present

## 2018-05-30 ENCOUNTER — Encounter: Payer: Self-pay | Admitting: Vascular Surgery

## 2018-05-30 ENCOUNTER — Ambulatory Visit (INDEPENDENT_AMBULATORY_CARE_PROVIDER_SITE_OTHER): Payer: PPO | Admitting: Vascular Surgery

## 2018-05-30 VITALS — BP 142/58 | HR 70 | Temp 97.5°F | Resp 18 | Ht 64.0 in | Wt 162.4 lb

## 2018-05-30 DIAGNOSIS — I6523 Occlusion and stenosis of bilateral carotid arteries: Secondary | ICD-10-CM

## 2018-05-30 NOTE — Progress Notes (Signed)
Vascular and Vein Specialist of [facility] office  Patient name: Tamara Sandoval MRN: [id number] DOB: 1940-10-29 Sex: female  REASON FOR CONSULT: Evaluation of carotid disease  HPI: Tamara Sandoval is a 78 y.o. female, who is here today with her husband for discussion of carotid duplex.  She had an episode in late January where she had confusion.  She had taken a shower and was out of the shower with soap still on her and her husband had to help her back in the shower to wash the soap off.  She then had have assistance in getting dressed.  She reports this lasted for approximately 7 hours with confusion.  She initially went to [facility] scan there showed no evidence of bleed.  Apparently there was some question of a thrombus.  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  She is back to her baseline.  She does not have recall of the specific event but has no new deficits and has had no other episodes similar to this.  She specifically denies any focal deficits at the time.  Her husband corroborates this as well.  She underwent carotid duplex as well and this showed low velocities in her internal carotid artery.  There was some discussion from the interpreting radiologist of calcification potentially hiding higher levels of stenosis.  Past Medical History:  Diagnosis Date  . Cervical spondylosis without myelopathy 07/30/2014  . 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 Glaucoma   . Hypercholesteremia   . Hypertension   . Hypothyroid   . Neck pain     Family History  Problem Relation Age of Onset  . Pneumonia Mother   . Tuberculosis Mother   . Hypertension Sister   . Cancer Brother   . Hypertension Maternal Aunt   . Dementia Paternal Aunt   . Cancer Cousin        ovarian  . Hypercholesterolemia Cousin   . Hypertension Sister   . Colon cancer Neg  Hx     SOCIAL HISTORY: Social History   Socioeconomic History  . Marital status: Married    Spouse name: Not on file  . Number of children: 2  . Years of education: doctorate  . Highest education level: Not on file  Occupational History  . Occupation: retired    Comment: Proofreader  . Financial resource strain: Not on file  . Food insecurity:    Worry: Not on file    Inability: Not on file  . Transportation needs:    Medical: Not on file    Non-medical: Not on file  Tobacco Use  . Smoking status: Never Smoker  . Smokeless tobacco: Never Used  Substance and Sexual Activity  . Alcohol use: No    Alcohol/week: 0.0 standard drinks  . Drug use: No  . Sexual activity: Not on file  Lifestyle  . Physical activity:    Days per week: Not on file    Minutes per session: Not on file  . Stress: Not on file  Relationships  . Social connections:    Talks on phone: Not on file    Gets together: Not on file    Attends religious service: Not on file    Active member of club or organization: Not on file    Attends meetings of clubs or organizations: Not on file    Relationship  status: Not on file  . Intimate partner violence:    Fear of current or ex partner: Not on file    Emotionally abused: Not on file    Physically abused: Not on file    Forced sexual activity: Not on file  Other Topics Concern  . Not on file  Social History Narrative   Patient is right handed.   Patient drinks 2-3 cups of caffeine daily.    Allergies  Allergen Reactions  . Bactrim [Sulfamethoxazole-Trimethoprim] Rash    Current Outpatient Medications  Medication Sig Dispense Refill  . ALPRAZolam (XANAX) 0.5 MG tablet Take 0.5 mg by mouth at bedtime as needed for anxiety.    Marland Kitchen atorvastatin (LIPITOR) 10 MG tablet Take 10 mg by mouth daily.    Marland Kitchen gabapentin (NEURONTIN) 100 MG capsule Take 100 mg by mouth 3 (three) times daily.    Marland Kitchen dexlansoprazole (DEXILANT) 60 MG capsule Take 1 capsule (60 mg  total) by mouth daily. 30 capsule 11  . estradiol (ESTRACE) 1 MG tablet Take 1 mg by mouth daily.    Marland Kitchen LATANOPROST OP Apply 1 drop to eye daily.    Marland Kitchen levothyroxine (SYNTHROID, LEVOTHROID) 100 MCG tablet Take 100 mcg by mouth daily before breakfast.    . lisinopril (PRINIVIL,ZESTRIL) 10 MG tablet Take 10 mg by mouth daily.    Marland Kitchen LUMIGAN 0.01 % SOLN     . omeprazole (PRILOSEC) 40 MG capsule Take 40 mg by mouth daily.    . potassium gluconate 595 MG TABS tablet Take 595 mg by mouth daily.    . sucralfate (CARAFATE) 1 g tablet     . triamterene-hydrochlorothiazide (DYAZIDE) 37.5-25 MG per capsule Take 1 capsule by mouth daily.     No current facility-administered medications for this visit.     REVIEW OF SYSTEMS:  [X]  denotes positive finding, [ ]  denotes negative finding Cardiac  Comments:  Chest pain or chest pressure:    Shortness of breath upon exertion:    Short of breath when lying flat:    Irregular heart rhythm:        Vascular    Pain in calf, thigh, or hip brought on by ambulation:    Pain in feet at night that wakes you up from your sleep:     Blood clot in your veins:    Leg swelling:         Pulmonary    Oxygen at home:    Productive cough:     Wheezing:         Neurologic    Sudden weakness in arms or legs:     Sudden numbness in arms or legs:     Sudden onset of difficulty speaking or slurred speech:    Temporary loss of vision in one eye:     Problems with dizziness:         Gastrointestinal    Blood in stool:     Vomited blood:         Genitourinary    Burning when urinating:     Blood in urine:        Psychiatric    Major depression:         Hematologic    Bleeding problems:    Problems with blood clotting too easily:        Skin    Rashes or ulcers:        Constitutional    Fever or chills:      PHYSICAL EXAM:  Vitals:   05/30/18 1515 05/30/18 1517  BP: (!) 141/58 (!) 142/58  Pulse: 70 70  Resp: 18   Temp: (!) 97.5 F (36.4 C)     TempSrc: Temporal   Weight: 162 lb 6.4 oz (73.7 kg)   Height: 5\' 4"  (1.626 m)     GENERAL: The patient is a well-nourished female, in no acute distress. The vital signs are documented above. CARDIOVASCULAR: Carotid arteries without bruits bilaterally.  2+ radial pulses bilaterally PULMONARY: There is good air exchange  ABDOMEN: Soft and non-tender  MUSCULOSKELETAL: There are no major deformities or cyanosis. NEUROLOGIC: No focal weakness or paresthesias are detected. SKIN: There are no ulcers or rashes noted. PSYCHIATRIC: The patient has a normal affect.  DATA:  Carotid duplex from [facility] on 05/18/2018 was reviewed.  This showed evidence of elevated velocities through the internal carotid arteries bilaterally.  She does have some calcification and there was interpretation by the radiologist that this could potentially obscure higher levels of stenosis  MEDICAL ISSUES: Very long discussion with the patient and her husband.  I explained that her episode and January was not consistent with carotid distribution issues.  She had no focal deficits.  It is unclear what caused this episode of confusion.  She did have a carotid duplex in 2016 with nearly identical flow velocities suggesting no significant stenosis.  Do not feel that she has any indication for CT angiogram or further evaluation since she is totally asymptomatic.  I would recommend that we see her again in 6 months with repeat carotid duplex to assure no change or progression.  Assuming this shows similar velocities to her study we would discontinue follow-up at that time.  She denies to contact us should she have any focal deficits and report immediately to the emergency room should this be severe   Rosetta Posner, MD [facility] Vascular and Vein Specialists of [facility] Tel [phone] Pager [phone]

## 2018-06-08 DIAGNOSIS — E039 Hypothyroidism, unspecified: Secondary | ICD-10-CM | POA: Diagnosis not present

## 2018-08-12 DIAGNOSIS — E039 Hypothyroidism, unspecified: Secondary | ICD-10-CM | POA: Diagnosis not present

## 2018-08-12 DIAGNOSIS — I10 Essential (primary) hypertension: Secondary | ICD-10-CM | POA: Diagnosis not present

## 2018-08-12 DIAGNOSIS — E782 Mixed hyperlipidemia: Secondary | ICD-10-CM | POA: Diagnosis not present

## 2018-08-17 DIAGNOSIS — B0223 Postherpetic polyneuropathy: Secondary | ICD-10-CM | POA: Diagnosis not present

## 2018-08-17 DIAGNOSIS — E785 Hyperlipidemia, unspecified: Secondary | ICD-10-CM | POA: Diagnosis not present

## 2018-08-17 DIAGNOSIS — N183 Chronic kidney disease, stage 3 (moderate): Secondary | ICD-10-CM | POA: Diagnosis not present

## 2018-08-17 DIAGNOSIS — R600 Localized edema: Secondary | ICD-10-CM | POA: Diagnosis not present

## 2018-08-17 DIAGNOSIS — G4700 Insomnia, unspecified: Secondary | ICD-10-CM | POA: Diagnosis not present

## 2018-08-17 DIAGNOSIS — I129 Hypertensive chronic kidney disease with stage 1 through stage 4 chronic kidney disease, or unspecified chronic kidney disease: Secondary | ICD-10-CM | POA: Diagnosis not present

## 2018-08-17 DIAGNOSIS — E039 Hypothyroidism, unspecified: Secondary | ICD-10-CM | POA: Diagnosis not present

## 2018-08-17 DIAGNOSIS — K219 Gastro-esophageal reflux disease without esophagitis: Secondary | ICD-10-CM | POA: Diagnosis not present

## 2018-08-17 DIAGNOSIS — Z0000 Encounter for general adult medical examination without abnormal findings: Secondary | ICD-10-CM | POA: Diagnosis not present

## 2018-11-29 ENCOUNTER — Encounter (HOSPITAL_COMMUNITY): Payer: PPO

## 2018-11-29 ENCOUNTER — Ambulatory Visit: Payer: PPO | Admitting: Vascular Surgery

## 2019-01-16 ENCOUNTER — Other Ambulatory Visit: Payer: Self-pay

## 2019-01-16 DIAGNOSIS — I6523 Occlusion and stenosis of bilateral carotid arteries: Secondary | ICD-10-CM

## 2019-01-17 ENCOUNTER — Other Ambulatory Visit: Payer: Self-pay

## 2019-01-17 ENCOUNTER — Ambulatory Visit (INDEPENDENT_AMBULATORY_CARE_PROVIDER_SITE_OTHER): Payer: PPO | Admitting: Vascular Surgery

## 2019-01-17 ENCOUNTER — Ambulatory Visit (HOSPITAL_COMMUNITY)
Admission: RE | Admit: 2019-01-17 | Discharge: 2019-01-17 | Disposition: A | Discharge status: Home / Self Care | Payer: PPO | Source: Ambulatory Visit | Attending: Family | Admitting: Family

## 2019-01-17 ENCOUNTER — Encounter: Payer: Self-pay | Admitting: Vascular Surgery

## 2019-01-17 VITALS — BP 115/66 | HR 64 | Temp 97.3°F | Resp 20 | Ht 64.0 in | Wt 159.0 lb

## 2019-01-17 DIAGNOSIS — I6523 Occlusion and stenosis of bilateral carotid arteries: Secondary | ICD-10-CM | POA: Diagnosis not present

## 2019-01-17 NOTE — Progress Notes (Signed)
Vascular and Vein Specialist of [facility]  Patient name: Tamara Sandoval MRN: [id number] DOB: 1940/09/13 Sex: female  REASON FOR VISIT: Follow-up carotid stenosis  HPI: Tamara Sandoval is a 78 y.o. female here today for follow-up.  I had seen her in February 2020.  She had had an episode of confusion but no focal stenosis.  She had undergone carotid duplex at [facility] which showed mildly elevated velocities bilaterally but the radiologist interpretation was there was significant plaque that may be hiding higher frequencies.  Fortunately she has had no new neurologic deficit since my last visit with her.  She is here today for duplex follow-up  Past Medical History:  Diagnosis Date  . Cervical spondylosis without myelopathy 07/30/2014  . 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 Glaucoma   . Hypercholesteremia   . Hypertension   . Hypothyroid   . Neck pain     Family History  Problem Relation Age of Onset  . Pneumonia Mother   . Tuberculosis Mother   . Cancer Cousin        ovarian  . Hypercholesterolemia Cousin   . Hypertension Sister   . Cancer Brother   . Hypertension Maternal Aunt   . Dementia Paternal Aunt   . Hypertension Sister   . Colon cancer Neg Hx     SOCIAL HISTORY: Social History   Tobacco Use  . Smoking status: Never Smoker  . Smokeless tobacco: Never Used  Substance Use Topics  . Alcohol use: No    Alcohol/week: 0.0 standard drinks    Allergies  Allergen Reactions  . Bactrim [Sulfamethoxazole-Trimethoprim] Rash    Current Outpatient Medications  Medication Sig Dispense Refill  . ALPRAZolam (XANAX) 0.5 MG tablet Take 0.5 mg by mouth at bedtime as needed for anxiety.    Marland Kitchen atorvastatin (LIPITOR) 10 MG tablet Take 10 mg by mouth daily.    Marland Kitchen dexlansoprazole (DEXILANT) 60 MG capsule Take 1 capsule (60 mg total) by mouth daily. 30 capsule 11  . estradiol (ESTRACE) 1 MG tablet Take 1 mg by mouth daily.    Marland Kitchen gabapentin  (NEURONTIN) 100 MG capsule Take 100 mg by mouth 3 (three) times daily.    Marland Kitchen LATANOPROST OP Apply 1 drop to eye daily.    Marland Kitchen levothyroxine (SYNTHROID, LEVOTHROID) 100 MCG tablet Take 100 mcg by mouth daily before breakfast.    . lisinopril (PRINIVIL,ZESTRIL) 10 MG tablet Take 10 mg by mouth daily.    Marland Kitchen LUMIGAN 0.01 % SOLN     . omeprazole (PRILOSEC) 40 MG capsule Take 40 mg by mouth daily.    Marland Kitchen triamterene-hydrochlorothiazide (DYAZIDE) 37.5-25 MG per capsule Take 1 capsule by mouth daily.     No current facility-administered medications for this visit.     REVIEW OF SYSTEMS:  [X]  denotes positive finding, [ ]  denotes negative finding Cardiac  Comments:  Chest pain or chest pressure:    Shortness of breath upon exertion:    Short of breath when lying flat:    Irregular heart rhythm:        Vascular    Pain in calf, thigh, or hip brought on by ambulation:    Pain in feet at night that wakes you up from your sleep:     Blood clot in your veins:    Leg swelling:           PHYSICAL EXAM: Vitals:   01/17/19 1335 01/17/19 1338  BP: 129/62 115/66  Pulse: 64   Resp: 20  Temp: (!) 97.3 F (36.3 C)   SpO2: 97%   Weight: 159 lb (72.1 kg)   Height: 5\' 4"  (1.626 m)     GENERAL: The patient is a well-nourished female, in no acute distress. The vital signs are documented above. CARDIOVASCULAR: Carotid arteries without bruits bilaterally.  2+ radial pulses bilaterally.  Heart regular rate and rhythm without murmur PULMONARY: There is good air exchange  MUSCULOSKELETAL: There are no major deformities or cyanosis. NEUROLOGIC: No focal weakness or paresthesias are detected. SKIN: There are no ulcers or rashes noted. PSYCHIATRIC: The patient has a normal affect.  DATA:  Carotid duplex today shows no significant stenoses bilaterally with range of 1 to 39%.  MEDICAL ISSUES: I discussed these findings at length with the patient.  I feel that she has had somewhat of an overcall of her  degree of stenosis in the duplex from [facility].  She has no evidence of significant stenosis.  I would not recommend any ongoing follow-up.  If she has new neurologic deficit, would need initiation of standard neurologic work-up    Rosetta Posner, MD [facility] Vascular and Vein Specialists of [facility] Tel [phone] Pager [phone]

## 2019-02-15 DIAGNOSIS — H401132 Primary open-angle glaucoma, bilateral, moderate stage: Secondary | ICD-10-CM | POA: Diagnosis not present

## 2019-02-15 DIAGNOSIS — H2513 Age-related nuclear cataract, bilateral: Secondary | ICD-10-CM | POA: Diagnosis not present

## 2019-02-15 DIAGNOSIS — H4322 Crystalline deposits in vitreous body, left eye: Secondary | ICD-10-CM | POA: Diagnosis not present

## 2019-02-15 DIAGNOSIS — H0102B Squamous blepharitis left eye, upper and lower eyelids: Secondary | ICD-10-CM | POA: Diagnosis not present

## 2019-02-15 DIAGNOSIS — H0102A Squamous blepharitis right eye, upper and lower eyelids: Secondary | ICD-10-CM | POA: Diagnosis not present

## 2019-02-17 DIAGNOSIS — N189 Chronic kidney disease, unspecified: Secondary | ICD-10-CM | POA: Diagnosis not present

## 2019-02-17 DIAGNOSIS — I639 Cerebral infarction, unspecified: Secondary | ICD-10-CM | POA: Diagnosis not present

## 2019-02-17 DIAGNOSIS — K2100 Gastro-esophageal reflux disease with esophagitis, without bleeding: Secondary | ICD-10-CM | POA: Diagnosis not present

## 2019-02-17 DIAGNOSIS — E785 Hyperlipidemia, unspecified: Secondary | ICD-10-CM | POA: Diagnosis not present

## 2019-02-17 DIAGNOSIS — R413 Other amnesia: Secondary | ICD-10-CM | POA: Diagnosis not present

## 2019-02-17 DIAGNOSIS — J029 Acute pharyngitis, unspecified: Secondary | ICD-10-CM | POA: Diagnosis not present

## 2019-02-17 DIAGNOSIS — B0223 Postherpetic polyneuropathy: Secondary | ICD-10-CM | POA: Diagnosis not present

## 2019-02-17 DIAGNOSIS — Z712 Person consulting for explanation of examination or test findings: Secondary | ICD-10-CM | POA: Diagnosis not present

## 2019-02-17 DIAGNOSIS — Z0001 Encounter for general adult medical examination with abnormal findings: Secondary | ICD-10-CM | POA: Diagnosis not present

## 2019-02-17 DIAGNOSIS — G4700 Insomnia, unspecified: Secondary | ICD-10-CM | POA: Diagnosis not present

## 2019-02-17 DIAGNOSIS — Z6827 Body mass index (BMI) 27.0-27.9, adult: Secondary | ICD-10-CM | POA: Diagnosis not present

## 2019-02-17 DIAGNOSIS — J060 Acute laryngopharyngitis: Secondary | ICD-10-CM | POA: Diagnosis not present

## 2019-02-22 DIAGNOSIS — E039 Hypothyroidism, unspecified: Secondary | ICD-10-CM | POA: Diagnosis not present

## 2019-02-22 DIAGNOSIS — B0223 Postherpetic polyneuropathy: Secondary | ICD-10-CM | POA: Diagnosis not present

## 2019-02-22 DIAGNOSIS — G4700 Insomnia, unspecified: Secondary | ICD-10-CM | POA: Diagnosis not present

## 2019-02-22 DIAGNOSIS — K219 Gastro-esophageal reflux disease without esophagitis: Secondary | ICD-10-CM | POA: Diagnosis not present

## 2019-02-22 DIAGNOSIS — I129 Hypertensive chronic kidney disease with stage 1 through stage 4 chronic kidney disease, or unspecified chronic kidney disease: Secondary | ICD-10-CM | POA: Diagnosis not present

## 2019-02-22 DIAGNOSIS — E785 Hyperlipidemia, unspecified: Secondary | ICD-10-CM | POA: Diagnosis not present

## 2019-02-22 DIAGNOSIS — R600 Localized edema: Secondary | ICD-10-CM | POA: Diagnosis not present

## 2019-02-22 DIAGNOSIS — M25532 Pain in left wrist: Secondary | ICD-10-CM | POA: Diagnosis not present

## 2019-03-08 DIAGNOSIS — H2513 Age-related nuclear cataract, bilateral: Secondary | ICD-10-CM | POA: Diagnosis not present

## 2019-03-08 DIAGNOSIS — H401132 Primary open-angle glaucoma, bilateral, moderate stage: Secondary | ICD-10-CM | POA: Diagnosis not present

## 2019-03-08 DIAGNOSIS — H4322 Crystalline deposits in vitreous body, left eye: Secondary | ICD-10-CM | POA: Diagnosis not present

## 2019-03-08 DIAGNOSIS — H0102A Squamous blepharitis right eye, upper and lower eyelids: Secondary | ICD-10-CM | POA: Diagnosis not present

## 2019-03-08 DIAGNOSIS — H0102B Squamous blepharitis left eye, upper and lower eyelids: Secondary | ICD-10-CM | POA: Diagnosis not present

## 2019-03-15 ENCOUNTER — Other Ambulatory Visit: Payer: Self-pay

## 2019-04-03 DIAGNOSIS — E039 Hypothyroidism, unspecified: Secondary | ICD-10-CM | POA: Diagnosis not present

## 2019-04-06 DIAGNOSIS — H401123 Primary open-angle glaucoma, left eye, severe stage: Secondary | ICD-10-CM | POA: Diagnosis not present

## 2019-04-06 DIAGNOSIS — H2512 Age-related nuclear cataract, left eye: Secondary | ICD-10-CM | POA: Diagnosis not present

## 2019-05-28 DIAGNOSIS — H2511 Age-related nuclear cataract, right eye: Secondary | ICD-10-CM | POA: Diagnosis not present

## 2019-06-01 DIAGNOSIS — H401113 Primary open-angle glaucoma, right eye, severe stage: Secondary | ICD-10-CM | POA: Diagnosis not present

## 2019-06-01 DIAGNOSIS — H2511 Age-related nuclear cataract, right eye: Secondary | ICD-10-CM | POA: Diagnosis not present

## 2019-06-09 DIAGNOSIS — H16223 Keratoconjunctivitis sicca, not specified as Sjogren's, bilateral: Secondary | ICD-10-CM | POA: Diagnosis not present

## 2019-06-13 DIAGNOSIS — H26492 Other secondary cataract, left eye: Secondary | ICD-10-CM | POA: Diagnosis not present

## 2019-06-13 DIAGNOSIS — Z961 Presence of intraocular lens: Secondary | ICD-10-CM | POA: Diagnosis not present

## 2019-06-18 ENCOUNTER — Ambulatory Visit: Payer: PPO | Attending: Internal Medicine

## 2019-06-18 DIAGNOSIS — Z23 Encounter for immunization: Secondary | ICD-10-CM

## 2019-06-18 NOTE — Progress Notes (Signed)
   Covid-19 Vaccination Clinic  Name:  ASHELI MATYAS    MRN: [id number] DOB: 1940/06/03  06/18/2019  Ms. Mleczko was observed post Covid-19 immunization for 15 minutes without incidence. She was provided with Vaccine Information Sheet and instruction to access the V-Safe system.   Ms. Garbutt was instructed to call 911 with any severe reactions post vaccine: Marland Kitchen Difficulty breathing  . Swelling of your face and throat  . A fast heartbeat  . A bad rash all over your body  . Dizziness and weakness    Immunizations Administered    Name Date Dose VIS Date Route   Pfizer COVID-19 Vaccine 06/18/2019  1:47 PM 0.3 mL 03/31/2019 Intramuscular   Manufacturer: [place]   Lot: [id number]   [place]: [id number]

## 2019-07-04 DIAGNOSIS — H524 Presbyopia: Secondary | ICD-10-CM | POA: Diagnosis not present

## 2019-07-18 ENCOUNTER — Ambulatory Visit: Payer: PPO | Attending: Internal Medicine

## 2019-07-18 DIAGNOSIS — Z23 Encounter for immunization: Secondary | ICD-10-CM

## 2019-07-18 NOTE — Progress Notes (Signed)
   Covid-19 Vaccination Clinic  Name:  Tamara Sandoval    MRN: [id number] DOB: [birth year]  07/18/2019  [name] was observed post Covid-19 immunization for 15 minutes without incident. She was provided with Vaccine Information Sheet and instruction to access the V-Safe system.   [name] was instructed to call 911 with any severe reactions post vaccine: Marland Kitchen Difficulty breathing  . Swelling of face and throat  . A fast heartbeat  . A bad rash all over body  . Dizziness and weakness   Immunizations Administered    Name Date Dose VIS Date Route   Pfizer COVID-19 Vaccine 07/18/2019  1:40 PM 0.3 mL 03/31/2019 Intramuscular   Manufacturer: [place]   Lot: [id number]   [place]: [id number]

## 2019-08-15 DIAGNOSIS — R601 Generalized edema: Secondary | ICD-10-CM | POA: Diagnosis not present

## 2019-08-15 DIAGNOSIS — J029 Acute pharyngitis, unspecified: Secondary | ICD-10-CM | POA: Diagnosis not present

## 2019-08-15 DIAGNOSIS — M25552 Pain in left hip: Secondary | ICD-10-CM | POA: Diagnosis not present

## 2019-08-15 DIAGNOSIS — I10 Essential (primary) hypertension: Secondary | ICD-10-CM | POA: Diagnosis not present

## 2019-08-15 DIAGNOSIS — Z0001 Encounter for general adult medical examination with abnormal findings: Secondary | ICD-10-CM | POA: Diagnosis not present

## 2019-08-15 DIAGNOSIS — E039 Hypothyroidism, unspecified: Secondary | ICD-10-CM | POA: Diagnosis not present

## 2019-08-15 DIAGNOSIS — Z1331 Encounter for screening for depression: Secondary | ICD-10-CM | POA: Diagnosis not present

## 2019-08-15 DIAGNOSIS — K219 Gastro-esophageal reflux disease without esophagitis: Secondary | ICD-10-CM | POA: Diagnosis not present

## 2019-08-15 DIAGNOSIS — Z6827 Body mass index (BMI) 27.0-27.9, adult: Secondary | ICD-10-CM | POA: Diagnosis not present

## 2019-08-15 DIAGNOSIS — E782 Mixed hyperlipidemia: Secondary | ICD-10-CM | POA: Diagnosis not present

## 2019-09-15 DIAGNOSIS — I129 Hypertensive chronic kidney disease with stage 1 through stage 4 chronic kidney disease, or unspecified chronic kidney disease: Secondary | ICD-10-CM | POA: Diagnosis not present

## 2019-09-15 DIAGNOSIS — K219 Gastro-esophageal reflux disease without esophagitis: Secondary | ICD-10-CM | POA: Diagnosis not present

## 2019-09-15 DIAGNOSIS — J029 Acute pharyngitis, unspecified: Secondary | ICD-10-CM | POA: Diagnosis not present

## 2019-09-15 DIAGNOSIS — G4700 Insomnia, unspecified: Secondary | ICD-10-CM | POA: Diagnosis not present

## 2019-09-15 DIAGNOSIS — B0223 Postherpetic polyneuropathy: Secondary | ICD-10-CM | POA: Diagnosis not present

## 2019-09-15 DIAGNOSIS — E782 Mixed hyperlipidemia: Secondary | ICD-10-CM | POA: Diagnosis not present

## 2019-09-15 DIAGNOSIS — I639 Cerebral infarction, unspecified: Secondary | ICD-10-CM | POA: Diagnosis not present

## 2019-09-15 DIAGNOSIS — J060 Acute laryngopharyngitis: Secondary | ICD-10-CM | POA: Diagnosis not present

## 2019-09-15 DIAGNOSIS — I10 Essential (primary) hypertension: Secondary | ICD-10-CM | POA: Diagnosis not present

## 2019-09-15 DIAGNOSIS — B029 Zoster without complications: Secondary | ICD-10-CM | POA: Diagnosis not present

## 2019-09-15 DIAGNOSIS — E785 Hyperlipidemia, unspecified: Secondary | ICD-10-CM | POA: Diagnosis not present

## 2019-09-15 DIAGNOSIS — E039 Hypothyroidism, unspecified: Secondary | ICD-10-CM | POA: Diagnosis not present

## 2019-09-20 DIAGNOSIS — E663 Overweight: Secondary | ICD-10-CM | POA: Diagnosis not present

## 2019-09-20 DIAGNOSIS — K219 Gastro-esophageal reflux disease without esophagitis: Secondary | ICD-10-CM | POA: Diagnosis not present

## 2019-09-20 DIAGNOSIS — I129 Hypertensive chronic kidney disease with stage 1 through stage 4 chronic kidney disease, or unspecified chronic kidney disease: Secondary | ICD-10-CM | POA: Diagnosis not present

## 2019-09-20 DIAGNOSIS — G4700 Insomnia, unspecified: Secondary | ICD-10-CM | POA: Diagnosis not present

## 2019-09-20 DIAGNOSIS — R600 Localized edema: Secondary | ICD-10-CM | POA: Diagnosis not present

## 2019-09-20 DIAGNOSIS — E559 Vitamin D deficiency, unspecified: Secondary | ICD-10-CM | POA: Diagnosis not present

## 2019-09-20 DIAGNOSIS — E782 Mixed hyperlipidemia: Secondary | ICD-10-CM | POA: Diagnosis not present

## 2019-09-20 DIAGNOSIS — B0223 Postherpetic polyneuropathy: Secondary | ICD-10-CM | POA: Diagnosis not present

## 2019-09-20 DIAGNOSIS — N1831 Chronic kidney disease, stage 3a: Secondary | ICD-10-CM | POA: Diagnosis not present

## 2019-09-20 DIAGNOSIS — E039 Hypothyroidism, unspecified: Secondary | ICD-10-CM | POA: Diagnosis not present

## 2019-09-20 DIAGNOSIS — Z6828 Body mass index (BMI) 28.0-28.9, adult: Secondary | ICD-10-CM | POA: Diagnosis not present

## 2019-10-04 DIAGNOSIS — H0102A Squamous blepharitis right eye, upper and lower eyelids: Secondary | ICD-10-CM | POA: Diagnosis not present

## 2019-10-04 DIAGNOSIS — H401132 Primary open-angle glaucoma, bilateral, moderate stage: Secondary | ICD-10-CM | POA: Diagnosis not present

## 2019-10-04 DIAGNOSIS — H4322 Crystalline deposits in vitreous body, left eye: Secondary | ICD-10-CM | POA: Diagnosis not present

## 2019-10-04 DIAGNOSIS — H16223 Keratoconjunctivitis sicca, not specified as Sjogren's, bilateral: Secondary | ICD-10-CM | POA: Diagnosis not present

## 2019-10-04 DIAGNOSIS — Z961 Presence of intraocular lens: Secondary | ICD-10-CM | POA: Diagnosis not present

## 2019-10-04 DIAGNOSIS — H0102B Squamous blepharitis left eye, upper and lower eyelids: Secondary | ICD-10-CM | POA: Diagnosis not present

## 2019-11-01 DIAGNOSIS — R413 Other amnesia: Secondary | ICD-10-CM | POA: Diagnosis not present

## 2019-11-01 DIAGNOSIS — B0223 Postherpetic polyneuropathy: Secondary | ICD-10-CM | POA: Diagnosis not present

## 2019-11-01 DIAGNOSIS — J060 Acute laryngopharyngitis: Secondary | ICD-10-CM | POA: Diagnosis not present

## 2019-11-01 DIAGNOSIS — I639 Cerebral infarction, unspecified: Secondary | ICD-10-CM | POA: Diagnosis not present

## 2019-11-01 DIAGNOSIS — J019 Acute sinusitis, unspecified: Secondary | ICD-10-CM | POA: Diagnosis not present

## 2019-11-01 DIAGNOSIS — N189 Chronic kidney disease, unspecified: Secondary | ICD-10-CM | POA: Diagnosis not present

## 2019-11-01 DIAGNOSIS — G4700 Insomnia, unspecified: Secondary | ICD-10-CM | POA: Diagnosis not present

## 2019-11-01 DIAGNOSIS — N1831 Chronic kidney disease, stage 3a: Secondary | ICD-10-CM | POA: Diagnosis not present

## 2019-11-01 DIAGNOSIS — J029 Acute pharyngitis, unspecified: Secondary | ICD-10-CM | POA: Diagnosis not present

## 2019-11-01 DIAGNOSIS — Z712 Person consulting for explanation of examination or test findings: Secondary | ICD-10-CM | POA: Diagnosis not present

## 2019-11-01 DIAGNOSIS — M25532 Pain in left wrist: Secondary | ICD-10-CM | POA: Diagnosis not present

## 2019-11-01 DIAGNOSIS — E039 Hypothyroidism, unspecified: Secondary | ICD-10-CM | POA: Diagnosis not present

## 2019-11-01 DIAGNOSIS — Z0001 Encounter for general adult medical examination with abnormal findings: Secondary | ICD-10-CM | POA: Diagnosis not present

## 2019-11-07 ENCOUNTER — Other Ambulatory Visit: Payer: Self-pay | Admitting: Internal Medicine

## 2019-11-07 ENCOUNTER — Other Ambulatory Visit (HOSPITAL_COMMUNITY): Payer: Self-pay | Admitting: Internal Medicine

## 2019-11-07 DIAGNOSIS — E039 Hypothyroidism, unspecified: Secondary | ICD-10-CM

## 2019-11-10 DIAGNOSIS — D519 Vitamin B12 deficiency anemia, unspecified: Secondary | ICD-10-CM | POA: Diagnosis not present

## 2019-11-21 ENCOUNTER — Ambulatory Visit (HOSPITAL_COMMUNITY)
Admission: RE | Admit: 2019-11-21 | Discharge: 2019-11-21 | Disposition: A | Discharge status: Home / Self Care | Payer: PPO | Source: Ambulatory Visit | Attending: Internal Medicine | Admitting: Internal Medicine

## 2019-11-21 ENCOUNTER — Other Ambulatory Visit: Payer: Self-pay

## 2019-11-21 DIAGNOSIS — E039 Hypothyroidism, unspecified: Secondary | ICD-10-CM | POA: Insufficient documentation

## 2019-11-21 DIAGNOSIS — E079 Disorder of thyroid, unspecified: Secondary | ICD-10-CM | POA: Diagnosis not present

## 2019-12-18 DIAGNOSIS — I951 Orthostatic hypotension: Secondary | ICD-10-CM | POA: Diagnosis not present

## 2019-12-18 DIAGNOSIS — H814 Vertigo of central origin: Secondary | ICD-10-CM | POA: Diagnosis not present

## 2019-12-29 DIAGNOSIS — N189 Chronic kidney disease, unspecified: Secondary | ICD-10-CM | POA: Diagnosis not present

## 2019-12-29 DIAGNOSIS — Z712 Person consulting for explanation of examination or test findings: Secondary | ICD-10-CM | POA: Diagnosis not present

## 2019-12-29 DIAGNOSIS — N1831 Chronic kidney disease, stage 3a: Secondary | ICD-10-CM | POA: Diagnosis not present

## 2019-12-29 DIAGNOSIS — R42 Dizziness and giddiness: Secondary | ICD-10-CM | POA: Diagnosis not present

## 2019-12-29 DIAGNOSIS — I951 Orthostatic hypotension: Secondary | ICD-10-CM | POA: Diagnosis not present

## 2019-12-29 DIAGNOSIS — J029 Acute pharyngitis, unspecified: Secondary | ICD-10-CM | POA: Diagnosis not present

## 2019-12-29 DIAGNOSIS — J060 Acute laryngopharyngitis: Secondary | ICD-10-CM | POA: Diagnosis not present

## 2019-12-29 DIAGNOSIS — G4700 Insomnia, unspecified: Secondary | ICD-10-CM | POA: Diagnosis not present

## 2019-12-29 DIAGNOSIS — I639 Cerebral infarction, unspecified: Secondary | ICD-10-CM | POA: Diagnosis not present

## 2019-12-29 DIAGNOSIS — J019 Acute sinusitis, unspecified: Secondary | ICD-10-CM | POA: Diagnosis not present

## 2019-12-29 DIAGNOSIS — M25532 Pain in left wrist: Secondary | ICD-10-CM | POA: Diagnosis not present

## 2019-12-29 DIAGNOSIS — R413 Other amnesia: Secondary | ICD-10-CM | POA: Diagnosis not present

## 2019-12-29 DIAGNOSIS — B0223 Postherpetic polyneuropathy: Secondary | ICD-10-CM | POA: Diagnosis not present

## 2019-12-29 DIAGNOSIS — H814 Vertigo of central origin: Secondary | ICD-10-CM | POA: Diagnosis not present

## 2020-01-29 DIAGNOSIS — R42 Dizziness and giddiness: Secondary | ICD-10-CM | POA: Diagnosis not present

## 2020-01-29 DIAGNOSIS — M25532 Pain in left wrist: Secondary | ICD-10-CM | POA: Diagnosis not present

## 2020-01-29 DIAGNOSIS — N1831 Chronic kidney disease, stage 3a: Secondary | ICD-10-CM | POA: Diagnosis not present

## 2020-01-29 DIAGNOSIS — J060 Acute laryngopharyngitis: Secondary | ICD-10-CM | POA: Diagnosis not present

## 2020-01-29 DIAGNOSIS — J019 Acute sinusitis, unspecified: Secondary | ICD-10-CM | POA: Diagnosis not present

## 2020-01-29 DIAGNOSIS — I951 Orthostatic hypotension: Secondary | ICD-10-CM | POA: Diagnosis not present

## 2020-01-29 DIAGNOSIS — N189 Chronic kidney disease, unspecified: Secondary | ICD-10-CM | POA: Diagnosis not present

## 2020-01-29 DIAGNOSIS — B0223 Postherpetic polyneuropathy: Secondary | ICD-10-CM | POA: Diagnosis not present

## 2020-01-29 DIAGNOSIS — Z712 Person consulting for explanation of examination or test findings: Secondary | ICD-10-CM | POA: Diagnosis not present

## 2020-01-29 DIAGNOSIS — R413 Other amnesia: Secondary | ICD-10-CM | POA: Diagnosis not present

## 2020-01-29 DIAGNOSIS — J029 Acute pharyngitis, unspecified: Secondary | ICD-10-CM | POA: Diagnosis not present

## 2020-01-29 DIAGNOSIS — I639 Cerebral infarction, unspecified: Secondary | ICD-10-CM | POA: Diagnosis not present

## 2020-02-13 DIAGNOSIS — M25511 Pain in right shoulder: Secondary | ICD-10-CM | POA: Diagnosis not present

## 2020-02-13 DIAGNOSIS — S43421A Sprain of right rotator cuff capsule, initial encounter: Secondary | ICD-10-CM | POA: Diagnosis not present

## 2020-03-01 ENCOUNTER — Other Ambulatory Visit (HOSPITAL_COMMUNITY): Payer: Self-pay | Admitting: Orthopedic Surgery

## 2020-03-01 DIAGNOSIS — M25511 Pain in right shoulder: Secondary | ICD-10-CM | POA: Diagnosis not present

## 2020-03-01 DIAGNOSIS — M75111 Incomplete rotator cuff tear or rupture of right shoulder, not specified as traumatic: Secondary | ICD-10-CM

## 2020-03-12 ENCOUNTER — Ambulatory Visit (HOSPITAL_COMMUNITY): Payer: PPO

## 2020-03-28 ENCOUNTER — Ambulatory Visit (HOSPITAL_COMMUNITY)
Admission: RE | Admit: 2020-03-28 | Discharge: 2020-03-28 | Disposition: A | Discharge status: Home / Self Care | Payer: PPO | Source: Ambulatory Visit | Attending: Orthopedic Surgery | Admitting: Orthopedic Surgery

## 2020-03-28 DIAGNOSIS — M25511 Pain in right shoulder: Secondary | ICD-10-CM | POA: Diagnosis not present

## 2020-03-28 DIAGNOSIS — M75111 Incomplete rotator cuff tear or rupture of right shoulder, not specified as traumatic: Secondary | ICD-10-CM | POA: Insufficient documentation

## 2020-04-24 DIAGNOSIS — R0982 Postnasal drip: Secondary | ICD-10-CM | POA: Diagnosis not present

## 2020-04-24 DIAGNOSIS — H814 Vertigo of central origin: Secondary | ICD-10-CM | POA: Diagnosis not present

## 2020-04-24 DIAGNOSIS — J029 Acute pharyngitis, unspecified: Secondary | ICD-10-CM | POA: Diagnosis not present

## 2020-08-15 DIAGNOSIS — M25511 Pain in right shoulder: Secondary | ICD-10-CM | POA: Diagnosis not present

## 2020-08-15 DIAGNOSIS — R232 Flushing: Secondary | ICD-10-CM | POA: Diagnosis not present

## 2020-08-15 DIAGNOSIS — G4700 Insomnia, unspecified: Secondary | ICD-10-CM | POA: Diagnosis not present

## 2020-08-15 DIAGNOSIS — B0223 Postherpetic polyneuropathy: Secondary | ICD-10-CM | POA: Diagnosis not present

## 2020-08-15 DIAGNOSIS — E039 Hypothyroidism, unspecified: Secondary | ICD-10-CM | POA: Diagnosis not present

## 2020-08-15 DIAGNOSIS — I129 Hypertensive chronic kidney disease with stage 1 through stage 4 chronic kidney disease, or unspecified chronic kidney disease: Secondary | ICD-10-CM | POA: Diagnosis not present

## 2020-08-26 DIAGNOSIS — H4322 Crystalline deposits in vitreous body, left eye: Secondary | ICD-10-CM | POA: Diagnosis not present

## 2020-08-26 DIAGNOSIS — H16223 Keratoconjunctivitis sicca, not specified as Sjogren's, bilateral: Secondary | ICD-10-CM | POA: Diagnosis not present

## 2020-08-26 DIAGNOSIS — Z961 Presence of intraocular lens: Secondary | ICD-10-CM | POA: Diagnosis not present

## 2020-08-26 DIAGNOSIS — H26491 Other secondary cataract, right eye: Secondary | ICD-10-CM | POA: Diagnosis not present

## 2020-08-26 DIAGNOSIS — H0102A Squamous blepharitis right eye, upper and lower eyelids: Secondary | ICD-10-CM | POA: Diagnosis not present

## 2020-08-26 DIAGNOSIS — H0102B Squamous blepharitis left eye, upper and lower eyelids: Secondary | ICD-10-CM | POA: Diagnosis not present

## 2020-08-26 DIAGNOSIS — H401132 Primary open-angle glaucoma, bilateral, moderate stage: Secondary | ICD-10-CM | POA: Diagnosis not present

## 2020-08-27 ENCOUNTER — Ambulatory Visit: Payer: PPO | Admitting: Orthopedic Surgery

## 2020-08-29 DIAGNOSIS — E039 Hypothyroidism, unspecified: Secondary | ICD-10-CM | POA: Diagnosis not present

## 2020-08-29 DIAGNOSIS — D519 Vitamin B12 deficiency anemia, unspecified: Secondary | ICD-10-CM | POA: Diagnosis not present

## 2020-08-29 DIAGNOSIS — I951 Orthostatic hypotension: Secondary | ICD-10-CM | POA: Diagnosis not present

## 2020-08-29 DIAGNOSIS — I129 Hypertensive chronic kidney disease with stage 1 through stage 4 chronic kidney disease, or unspecified chronic kidney disease: Secondary | ICD-10-CM | POA: Diagnosis not present

## 2020-08-29 DIAGNOSIS — N182 Chronic kidney disease, stage 2 (mild): Secondary | ICD-10-CM | POA: Diagnosis not present

## 2020-08-29 DIAGNOSIS — I10 Essential (primary) hypertension: Secondary | ICD-10-CM | POA: Diagnosis not present

## 2020-08-29 DIAGNOSIS — E782 Mixed hyperlipidemia: Secondary | ICD-10-CM | POA: Diagnosis not present

## 2020-08-29 DIAGNOSIS — E559 Vitamin D deficiency, unspecified: Secondary | ICD-10-CM | POA: Diagnosis not present

## 2020-08-29 DIAGNOSIS — E785 Hyperlipidemia, unspecified: Secondary | ICD-10-CM | POA: Diagnosis not present

## 2020-08-29 DIAGNOSIS — N1831 Chronic kidney disease, stage 3a: Secondary | ICD-10-CM | POA: Diagnosis not present

## 2020-08-29 DIAGNOSIS — N189 Chronic kidney disease, unspecified: Secondary | ICD-10-CM | POA: Diagnosis not present

## 2020-08-29 DIAGNOSIS — I639 Cerebral infarction, unspecified: Secondary | ICD-10-CM | POA: Diagnosis not present

## 2020-09-05 ENCOUNTER — Other Ambulatory Visit (HOSPITAL_COMMUNITY): Payer: Self-pay | Admitting: Family Medicine

## 2020-09-05 DIAGNOSIS — E559 Vitamin D deficiency, unspecified: Secondary | ICD-10-CM | POA: Diagnosis not present

## 2020-09-05 DIAGNOSIS — Z1231 Encounter for screening mammogram for malignant neoplasm of breast: Secondary | ICD-10-CM

## 2020-09-05 DIAGNOSIS — M25551 Pain in right hip: Secondary | ICD-10-CM | POA: Diagnosis not present

## 2020-09-05 DIAGNOSIS — E039 Hypothyroidism, unspecified: Secondary | ICD-10-CM | POA: Diagnosis not present

## 2020-09-05 DIAGNOSIS — E785 Hyperlipidemia, unspecified: Secondary | ICD-10-CM | POA: Diagnosis not present

## 2020-09-05 DIAGNOSIS — N189 Chronic kidney disease, unspecified: Secondary | ICD-10-CM | POA: Diagnosis not present

## 2020-09-05 DIAGNOSIS — E538 Deficiency of other specified B group vitamins: Secondary | ICD-10-CM | POA: Diagnosis not present

## 2020-09-05 DIAGNOSIS — I10 Essential (primary) hypertension: Secondary | ICD-10-CM | POA: Diagnosis not present

## 2020-09-05 DIAGNOSIS — M25552 Pain in left hip: Secondary | ICD-10-CM | POA: Diagnosis not present

## 2020-10-04 ENCOUNTER — Ambulatory Visit (HOSPITAL_COMMUNITY)
Admission: RE | Admit: 2020-10-04 | Discharge: 2020-10-04 | Disposition: A | Discharge status: Home / Self Care | Payer: PPO | Source: Ambulatory Visit | Attending: Family Medicine | Admitting: Family Medicine

## 2020-10-04 ENCOUNTER — Other Ambulatory Visit: Payer: Self-pay

## 2020-10-04 DIAGNOSIS — M25551 Pain in right hip: Secondary | ICD-10-CM | POA: Insufficient documentation

## 2020-10-04 DIAGNOSIS — Z1231 Encounter for screening mammogram for malignant neoplasm of breast: Secondary | ICD-10-CM | POA: Diagnosis not present

## 2020-10-04 DIAGNOSIS — M25552 Pain in left hip: Secondary | ICD-10-CM

## 2020-10-04 DIAGNOSIS — R5383 Other fatigue: Secondary | ICD-10-CM | POA: Diagnosis not present

## 2020-10-04 DIAGNOSIS — M160 Bilateral primary osteoarthritis of hip: Secondary | ICD-10-CM | POA: Diagnosis not present

## 2020-10-04 DIAGNOSIS — Z9889 Other specified postprocedural states: Secondary | ICD-10-CM | POA: Diagnosis not present

## 2020-10-08 ENCOUNTER — Other Ambulatory Visit (HOSPITAL_COMMUNITY): Payer: Self-pay | Admitting: Family Medicine

## 2020-10-17 ENCOUNTER — Other Ambulatory Visit (HOSPITAL_COMMUNITY): Payer: Self-pay | Admitting: Internal Medicine

## 2020-10-17 DIAGNOSIS — R928 Other abnormal and inconclusive findings on diagnostic imaging of breast: Secondary | ICD-10-CM

## 2020-10-23 ENCOUNTER — Ambulatory Visit (HOSPITAL_COMMUNITY)
Admission: RE | Admit: 2020-10-23 | Discharge: 2020-10-23 | Disposition: A | Discharge status: Home / Self Care | Payer: PPO | Source: Ambulatory Visit | Attending: Internal Medicine | Admitting: Internal Medicine

## 2020-10-23 ENCOUNTER — Other Ambulatory Visit: Payer: Self-pay

## 2020-10-23 DIAGNOSIS — N6489 Other specified disorders of breast: Secondary | ICD-10-CM | POA: Diagnosis not present

## 2020-10-23 DIAGNOSIS — R928 Other abnormal and inconclusive findings on diagnostic imaging of breast: Secondary | ICD-10-CM

## 2020-10-23 DIAGNOSIS — R922 Inconclusive mammogram: Secondary | ICD-10-CM | POA: Diagnosis not present

## 2020-12-31 DIAGNOSIS — H0102A Squamous blepharitis right eye, upper and lower eyelids: Secondary | ICD-10-CM | POA: Diagnosis not present

## 2020-12-31 DIAGNOSIS — Z961 Presence of intraocular lens: Secondary | ICD-10-CM | POA: Diagnosis not present

## 2020-12-31 DIAGNOSIS — H401132 Primary open-angle glaucoma, bilateral, moderate stage: Secondary | ICD-10-CM | POA: Diagnosis not present

## 2020-12-31 DIAGNOSIS — H0102B Squamous blepharitis left eye, upper and lower eyelids: Secondary | ICD-10-CM | POA: Diagnosis not present

## 2020-12-31 DIAGNOSIS — H26491 Other secondary cataract, right eye: Secondary | ICD-10-CM | POA: Diagnosis not present

## 2020-12-31 DIAGNOSIS — H16223 Keratoconjunctivitis sicca, not specified as Sjogren's, bilateral: Secondary | ICD-10-CM | POA: Diagnosis not present

## 2020-12-31 DIAGNOSIS — H4322 Crystalline deposits in vitreous body, left eye: Secondary | ICD-10-CM | POA: Diagnosis not present

## 2021-03-03 DIAGNOSIS — E559 Vitamin D deficiency, unspecified: Secondary | ICD-10-CM | POA: Diagnosis not present

## 2021-03-03 DIAGNOSIS — I10 Essential (primary) hypertension: Secondary | ICD-10-CM | POA: Diagnosis not present

## 2021-03-05 ENCOUNTER — Other Ambulatory Visit (HOSPITAL_COMMUNITY): Payer: Self-pay | Admitting: Family Medicine

## 2021-03-05 DIAGNOSIS — I10 Essential (primary) hypertension: Secondary | ICD-10-CM | POA: Diagnosis not present

## 2021-03-05 DIAGNOSIS — E538 Deficiency of other specified B group vitamins: Secondary | ICD-10-CM | POA: Diagnosis not present

## 2021-03-05 DIAGNOSIS — E559 Vitamin D deficiency, unspecified: Secondary | ICD-10-CM | POA: Diagnosis not present

## 2021-03-05 DIAGNOSIS — M25551 Pain in right hip: Secondary | ICD-10-CM | POA: Diagnosis not present

## 2021-03-05 DIAGNOSIS — Z0001 Encounter for general adult medical examination with abnormal findings: Secondary | ICD-10-CM | POA: Diagnosis not present

## 2021-03-05 DIAGNOSIS — Z0000 Encounter for general adult medical examination without abnormal findings: Secondary | ICD-10-CM | POA: Diagnosis not present

## 2021-03-05 DIAGNOSIS — M25552 Pain in left hip: Secondary | ICD-10-CM | POA: Diagnosis not present

## 2021-03-05 DIAGNOSIS — E785 Hyperlipidemia, unspecified: Secondary | ICD-10-CM | POA: Diagnosis not present

## 2021-03-05 DIAGNOSIS — R928 Other abnormal and inconclusive findings on diagnostic imaging of breast: Secondary | ICD-10-CM | POA: Diagnosis not present

## 2021-03-05 DIAGNOSIS — E039 Hypothyroidism, unspecified: Secondary | ICD-10-CM | POA: Diagnosis not present

## 2021-03-05 DIAGNOSIS — Z23 Encounter for immunization: Secondary | ICD-10-CM | POA: Diagnosis not present

## 2021-03-05 DIAGNOSIS — Z13820 Encounter for screening for osteoporosis: Secondary | ICD-10-CM

## 2021-03-05 DIAGNOSIS — N189 Chronic kidney disease, unspecified: Secondary | ICD-10-CM | POA: Diagnosis not present

## 2021-04-16 DIAGNOSIS — M25552 Pain in left hip: Secondary | ICD-10-CM | POA: Diagnosis not present

## 2021-04-16 DIAGNOSIS — M25551 Pain in right hip: Secondary | ICD-10-CM | POA: Diagnosis not present

## 2021-04-16 DIAGNOSIS — M545 Low back pain, unspecified: Secondary | ICD-10-CM | POA: Diagnosis not present

## 2021-04-17 DIAGNOSIS — D225 Melanocytic nevi of trunk: Secondary | ICD-10-CM | POA: Diagnosis not present

## 2021-04-17 DIAGNOSIS — I872 Venous insufficiency (chronic) (peripheral): Secondary | ICD-10-CM | POA: Diagnosis not present

## 2021-04-17 DIAGNOSIS — Z1283 Encounter for screening for malignant neoplasm of skin: Secondary | ICD-10-CM | POA: Diagnosis not present

## 2021-04-17 DIAGNOSIS — L570 Actinic keratosis: Secondary | ICD-10-CM | POA: Diagnosis not present

## 2021-04-17 DIAGNOSIS — X32XXXA Exposure to sunlight, initial encounter: Secondary | ICD-10-CM | POA: Diagnosis not present

## 2021-04-24 DIAGNOSIS — M6281 Muscle weakness (generalized): Secondary | ICD-10-CM | POA: Diagnosis not present

## 2021-04-24 DIAGNOSIS — J0100 Acute maxillary sinusitis, unspecified: Secondary | ICD-10-CM | POA: Diagnosis not present

## 2021-04-24 DIAGNOSIS — J029 Acute pharyngitis, unspecified: Secondary | ICD-10-CM | POA: Diagnosis not present

## 2021-05-19 DIAGNOSIS — J069 Acute upper respiratory infection, unspecified: Secondary | ICD-10-CM | POA: Diagnosis not present

## 2021-06-03 ENCOUNTER — Ambulatory Visit (HOSPITAL_COMMUNITY)
Admission: RE | Admit: 2021-06-03 | Discharge: 2021-06-03 | Disposition: A | Discharge status: Home / Self Care | Payer: PPO | Source: Ambulatory Visit | Attending: Family Medicine | Admitting: Family Medicine

## 2021-06-03 ENCOUNTER — Other Ambulatory Visit: Payer: Self-pay

## 2021-06-03 ENCOUNTER — Other Ambulatory Visit (HOSPITAL_COMMUNITY): Payer: Self-pay | Admitting: Family Medicine

## 2021-06-03 DIAGNOSIS — R350 Frequency of micturition: Secondary | ICD-10-CM | POA: Diagnosis not present

## 2021-06-03 DIAGNOSIS — J9811 Atelectasis: Secondary | ICD-10-CM | POA: Diagnosis not present

## 2021-06-03 DIAGNOSIS — N3000 Acute cystitis without hematuria: Secondary | ICD-10-CM | POA: Diagnosis not present

## 2021-06-03 DIAGNOSIS — J069 Acute upper respiratory infection, unspecified: Secondary | ICD-10-CM | POA: Insufficient documentation

## 2021-06-04 DIAGNOSIS — N3000 Acute cystitis without hematuria: Secondary | ICD-10-CM | POA: Diagnosis not present

## 2021-06-12 DIAGNOSIS — E785 Hyperlipidemia, unspecified: Secondary | ICD-10-CM | POA: Diagnosis not present

## 2021-06-12 DIAGNOSIS — E0590 Thyrotoxicosis, unspecified without thyrotoxic crisis or storm: Secondary | ICD-10-CM | POA: Diagnosis not present

## 2021-06-12 DIAGNOSIS — F419 Anxiety disorder, unspecified: Secondary | ICD-10-CM | POA: Diagnosis not present

## 2021-06-12 DIAGNOSIS — I10 Essential (primary) hypertension: Secondary | ICD-10-CM | POA: Diagnosis not present

## 2021-06-12 DIAGNOSIS — N959 Unspecified menopausal and perimenopausal disorder: Secondary | ICD-10-CM | POA: Diagnosis not present

## 2021-06-12 DIAGNOSIS — R300 Dysuria: Secondary | ICD-10-CM | POA: Diagnosis not present

## 2021-07-21 DIAGNOSIS — L570 Actinic keratosis: Secondary | ICD-10-CM | POA: Diagnosis not present

## 2021-07-21 DIAGNOSIS — X32XXXD Exposure to sunlight, subsequent encounter: Secondary | ICD-10-CM | POA: Diagnosis not present

## 2021-08-19 DIAGNOSIS — H16223 Keratoconjunctivitis sicca, not specified as Sjogren's, bilateral: Secondary | ICD-10-CM | POA: Diagnosis not present

## 2021-08-19 DIAGNOSIS — H401132 Primary open-angle glaucoma, bilateral, moderate stage: Secondary | ICD-10-CM | POA: Diagnosis not present

## 2021-08-19 DIAGNOSIS — H0102B Squamous blepharitis left eye, upper and lower eyelids: Secondary | ICD-10-CM | POA: Diagnosis not present

## 2021-08-19 DIAGNOSIS — Z961 Presence of intraocular lens: Secondary | ICD-10-CM | POA: Diagnosis not present

## 2021-08-19 DIAGNOSIS — H4322 Crystalline deposits in vitreous body, left eye: Secondary | ICD-10-CM | POA: Diagnosis not present

## 2021-08-19 DIAGNOSIS — H0102A Squamous blepharitis right eye, upper and lower eyelids: Secondary | ICD-10-CM | POA: Diagnosis not present

## 2021-08-27 DIAGNOSIS — I10 Essential (primary) hypertension: Secondary | ICD-10-CM | POA: Diagnosis not present

## 2021-08-27 DIAGNOSIS — E785 Hyperlipidemia, unspecified: Secondary | ICD-10-CM | POA: Diagnosis not present

## 2021-08-27 DIAGNOSIS — E039 Hypothyroidism, unspecified: Secondary | ICD-10-CM | POA: Diagnosis not present

## 2021-09-01 DIAGNOSIS — N959 Unspecified menopausal and perimenopausal disorder: Secondary | ICD-10-CM | POA: Diagnosis not present

## 2021-09-01 DIAGNOSIS — E538 Deficiency of other specified B group vitamins: Secondary | ICD-10-CM | POA: Diagnosis not present

## 2021-09-01 DIAGNOSIS — R21 Rash and other nonspecific skin eruption: Secondary | ICD-10-CM | POA: Diagnosis not present

## 2021-09-01 DIAGNOSIS — E785 Hyperlipidemia, unspecified: Secondary | ICD-10-CM | POA: Diagnosis not present

## 2021-09-01 DIAGNOSIS — R928 Other abnormal and inconclusive findings on diagnostic imaging of breast: Secondary | ICD-10-CM | POA: Diagnosis not present

## 2021-09-01 DIAGNOSIS — B0229 Other postherpetic nervous system involvement: Secondary | ICD-10-CM | POA: Diagnosis not present

## 2021-09-01 DIAGNOSIS — F419 Anxiety disorder, unspecified: Secondary | ICD-10-CM | POA: Diagnosis not present

## 2021-09-01 DIAGNOSIS — E039 Hypothyroidism, unspecified: Secondary | ICD-10-CM | POA: Diagnosis not present

## 2021-09-01 DIAGNOSIS — I10 Essential (primary) hypertension: Secondary | ICD-10-CM | POA: Diagnosis not present

## 2021-09-01 DIAGNOSIS — E559 Vitamin D deficiency, unspecified: Secondary | ICD-10-CM | POA: Diagnosis not present

## 2021-09-01 DIAGNOSIS — N189 Chronic kidney disease, unspecified: Secondary | ICD-10-CM | POA: Diagnosis not present

## 2021-09-01 DIAGNOSIS — Z0001 Encounter for general adult medical examination with abnormal findings: Secondary | ICD-10-CM | POA: Diagnosis not present

## 2021-09-02 ENCOUNTER — Other Ambulatory Visit (HOSPITAL_COMMUNITY): Payer: Self-pay | Admitting: Family Medicine

## 2021-09-02 DIAGNOSIS — N6320 Unspecified lump in the left breast, unspecified quadrant: Secondary | ICD-10-CM

## 2021-10-09 ENCOUNTER — Ambulatory Visit (HOSPITAL_COMMUNITY)
Admission: RE | Admit: 2021-10-09 | Discharge: 2021-10-09 | Disposition: A | Discharge status: Home / Self Care | Payer: PPO | Source: Ambulatory Visit | Attending: Family Medicine | Admitting: Family Medicine

## 2021-10-09 DIAGNOSIS — N6320 Unspecified lump in the left breast, unspecified quadrant: Secondary | ICD-10-CM | POA: Diagnosis present

## 2021-10-09 DIAGNOSIS — N6324 Unspecified lump in the left breast, lower inner quadrant: Secondary | ICD-10-CM | POA: Diagnosis not present

## 2021-10-09 DIAGNOSIS — Z1239 Encounter for other screening for malignant neoplasm of breast: Secondary | ICD-10-CM | POA: Insufficient documentation

## 2021-10-09 DIAGNOSIS — R928 Other abnormal and inconclusive findings on diagnostic imaging of breast: Secondary | ICD-10-CM | POA: Insufficient documentation

## 2021-12-30 DIAGNOSIS — Z961 Presence of intraocular lens: Secondary | ICD-10-CM | POA: Diagnosis not present

## 2021-12-30 DIAGNOSIS — H401132 Primary open-angle glaucoma, bilateral, moderate stage: Secondary | ICD-10-CM | POA: Diagnosis not present

## 2021-12-30 DIAGNOSIS — H4322 Crystalline deposits in vitreous body, left eye: Secondary | ICD-10-CM | POA: Diagnosis not present

## 2021-12-30 DIAGNOSIS — H0102B Squamous blepharitis left eye, upper and lower eyelids: Secondary | ICD-10-CM | POA: Diagnosis not present

## 2021-12-30 DIAGNOSIS — H0102A Squamous blepharitis right eye, upper and lower eyelids: Secondary | ICD-10-CM | POA: Diagnosis not present

## 2022-03-03 DIAGNOSIS — I10 Essential (primary) hypertension: Secondary | ICD-10-CM | POA: Diagnosis not present

## 2022-03-03 DIAGNOSIS — E039 Hypothyroidism, unspecified: Secondary | ICD-10-CM | POA: Diagnosis not present

## 2022-03-03 DIAGNOSIS — E785 Hyperlipidemia, unspecified: Secondary | ICD-10-CM | POA: Diagnosis not present

## 2022-03-03 DIAGNOSIS — E559 Vitamin D deficiency, unspecified: Secondary | ICD-10-CM | POA: Diagnosis not present

## 2022-03-25 DIAGNOSIS — E538 Deficiency of other specified B group vitamins: Secondary | ICD-10-CM | POA: Diagnosis not present

## 2022-03-25 DIAGNOSIS — E785 Hyperlipidemia, unspecified: Secondary | ICD-10-CM | POA: Diagnosis not present

## 2022-03-25 DIAGNOSIS — Z0000 Encounter for general adult medical examination without abnormal findings: Secondary | ICD-10-CM | POA: Diagnosis not present

## 2022-03-25 DIAGNOSIS — E559 Vitamin D deficiency, unspecified: Secondary | ICD-10-CM | POA: Diagnosis not present

## 2022-03-25 DIAGNOSIS — N189 Chronic kidney disease, unspecified: Secondary | ICD-10-CM | POA: Diagnosis not present

## 2022-03-25 DIAGNOSIS — R928 Other abnormal and inconclusive findings on diagnostic imaging of breast: Secondary | ICD-10-CM | POA: Diagnosis not present

## 2022-03-25 DIAGNOSIS — I10 Essential (primary) hypertension: Secondary | ICD-10-CM | POA: Diagnosis not present

## 2022-03-25 DIAGNOSIS — N959 Unspecified menopausal and perimenopausal disorder: Secondary | ICD-10-CM | POA: Diagnosis not present

## 2022-03-25 DIAGNOSIS — B0229 Other postherpetic nervous system involvement: Secondary | ICD-10-CM | POA: Diagnosis not present

## 2022-03-25 DIAGNOSIS — F419 Anxiety disorder, unspecified: Secondary | ICD-10-CM | POA: Diagnosis not present

## 2022-03-25 DIAGNOSIS — E039 Hypothyroidism, unspecified: Secondary | ICD-10-CM | POA: Diagnosis not present

## 2022-03-25 DIAGNOSIS — R21 Rash and other nonspecific skin eruption: Secondary | ICD-10-CM | POA: Diagnosis not present

## 2022-05-19 ENCOUNTER — Ambulatory Visit (HOSPITAL_COMMUNITY)
Admission: RE | Admit: 2022-05-19 | Discharge: 2022-05-19 | Disposition: A | Discharge status: Home / Self Care | Payer: PPO | Source: Ambulatory Visit | Attending: Family Medicine | Admitting: Family Medicine

## 2022-05-19 ENCOUNTER — Other Ambulatory Visit (HOSPITAL_COMMUNITY): Payer: Self-pay | Admitting: Family Medicine

## 2022-05-19 DIAGNOSIS — R0981 Nasal congestion: Secondary | ICD-10-CM | POA: Diagnosis not present

## 2022-05-19 DIAGNOSIS — M79674 Pain in right toe(s): Secondary | ICD-10-CM | POA: Insufficient documentation

## 2022-05-19 DIAGNOSIS — J029 Acute pharyngitis, unspecified: Secondary | ICD-10-CM | POA: Diagnosis not present

## 2022-09-18 DIAGNOSIS — I10 Essential (primary) hypertension: Secondary | ICD-10-CM | POA: Diagnosis not present

## 2022-09-18 DIAGNOSIS — E559 Vitamin D deficiency, unspecified: Secondary | ICD-10-CM | POA: Diagnosis not present

## 2022-09-18 DIAGNOSIS — E039 Hypothyroidism, unspecified: Secondary | ICD-10-CM | POA: Diagnosis not present

## 2022-09-18 DIAGNOSIS — E785 Hyperlipidemia, unspecified: Secondary | ICD-10-CM | POA: Diagnosis not present

## 2022-09-23 DIAGNOSIS — Z0000 Encounter for general adult medical examination without abnormal findings: Secondary | ICD-10-CM | POA: Diagnosis not present

## 2022-09-24 DIAGNOSIS — N189 Chronic kidney disease, unspecified: Secondary | ICD-10-CM | POA: Diagnosis not present

## 2022-09-24 DIAGNOSIS — E785 Hyperlipidemia, unspecified: Secondary | ICD-10-CM | POA: Diagnosis not present

## 2022-09-24 DIAGNOSIS — I129 Hypertensive chronic kidney disease with stage 1 through stage 4 chronic kidney disease, or unspecified chronic kidney disease: Secondary | ICD-10-CM | POA: Diagnosis not present

## 2022-09-24 DIAGNOSIS — E538 Deficiency of other specified B group vitamins: Secondary | ICD-10-CM | POA: Diagnosis not present

## 2022-09-24 DIAGNOSIS — R928 Other abnormal and inconclusive findings on diagnostic imaging of breast: Secondary | ICD-10-CM | POA: Diagnosis not present

## 2022-09-24 DIAGNOSIS — R21 Rash and other nonspecific skin eruption: Secondary | ICD-10-CM | POA: Diagnosis not present

## 2022-09-24 DIAGNOSIS — R519 Headache, unspecified: Secondary | ICD-10-CM | POA: Diagnosis not present

## 2022-09-24 DIAGNOSIS — R55 Syncope and collapse: Secondary | ICD-10-CM | POA: Diagnosis not present

## 2022-09-24 DIAGNOSIS — E559 Vitamin D deficiency, unspecified: Secondary | ICD-10-CM | POA: Diagnosis not present

## 2022-09-24 DIAGNOSIS — E039 Hypothyroidism, unspecified: Secondary | ICD-10-CM | POA: Diagnosis not present

## 2022-09-24 DIAGNOSIS — Z0000 Encounter for general adult medical examination without abnormal findings: Secondary | ICD-10-CM | POA: Diagnosis not present

## 2022-09-24 DIAGNOSIS — I10 Essential (primary) hypertension: Secondary | ICD-10-CM | POA: Diagnosis not present

## 2022-12-31 DIAGNOSIS — X32XXXD Exposure to sunlight, subsequent encounter: Secondary | ICD-10-CM | POA: Diagnosis not present

## 2022-12-31 DIAGNOSIS — L578 Other skin changes due to chronic exposure to nonionizing radiation: Secondary | ICD-10-CM | POA: Diagnosis not present

## 2022-12-31 DIAGNOSIS — L570 Actinic keratosis: Secondary | ICD-10-CM | POA: Diagnosis not present

## 2022-12-31 DIAGNOSIS — D0471 Carcinoma in situ of skin of right lower limb, including hip: Secondary | ICD-10-CM | POA: Diagnosis not present

## 2023-03-08 DIAGNOSIS — F419 Anxiety disorder, unspecified: Secondary | ICD-10-CM | POA: Diagnosis not present

## 2023-03-08 DIAGNOSIS — I951 Orthostatic hypotension: Secondary | ICD-10-CM | POA: Diagnosis not present

## 2023-03-30 DIAGNOSIS — I10 Essential (primary) hypertension: Secondary | ICD-10-CM | POA: Diagnosis not present

## 2023-04-01 DIAGNOSIS — Z85828 Personal history of other malignant neoplasm of skin: Secondary | ICD-10-CM | POA: Diagnosis not present

## 2023-04-01 DIAGNOSIS — Z08 Encounter for follow-up examination after completed treatment for malignant neoplasm: Secondary | ICD-10-CM | POA: Diagnosis not present

## 2023-04-06 DIAGNOSIS — I10 Essential (primary) hypertension: Secondary | ICD-10-CM | POA: Diagnosis not present

## 2023-04-06 DIAGNOSIS — Z0001 Encounter for general adult medical examination with abnormal findings: Secondary | ICD-10-CM | POA: Diagnosis not present

## 2023-04-06 DIAGNOSIS — N189 Chronic kidney disease, unspecified: Secondary | ICD-10-CM | POA: Diagnosis not present

## 2023-04-06 DIAGNOSIS — Z0000 Encounter for general adult medical examination without abnormal findings: Secondary | ICD-10-CM | POA: Diagnosis not present

## 2023-04-06 DIAGNOSIS — R928 Other abnormal and inconclusive findings on diagnostic imaging of breast: Secondary | ICD-10-CM | POA: Diagnosis not present

## 2023-04-06 DIAGNOSIS — N390 Urinary tract infection, site not specified: Secondary | ICD-10-CM | POA: Diagnosis not present

## 2023-04-06 DIAGNOSIS — I129 Hypertensive chronic kidney disease with stage 1 through stage 4 chronic kidney disease, or unspecified chronic kidney disease: Secondary | ICD-10-CM | POA: Diagnosis not present

## 2023-04-06 DIAGNOSIS — E559 Vitamin D deficiency, unspecified: Secondary | ICD-10-CM | POA: Diagnosis not present

## 2023-04-06 DIAGNOSIS — R600 Localized edema: Secondary | ICD-10-CM | POA: Diagnosis not present

## 2023-04-06 DIAGNOSIS — E538 Deficiency of other specified B group vitamins: Secondary | ICD-10-CM | POA: Diagnosis not present

## 2023-04-06 DIAGNOSIS — Z23 Encounter for immunization: Secondary | ICD-10-CM | POA: Diagnosis not present

## 2023-04-06 DIAGNOSIS — E785 Hyperlipidemia, unspecified: Secondary | ICD-10-CM | POA: Diagnosis not present

## 2023-04-07 ENCOUNTER — Other Ambulatory Visit (HOSPITAL_COMMUNITY): Payer: Self-pay | Admitting: Family Medicine

## 2023-04-07 DIAGNOSIS — N6002 Solitary cyst of left breast: Secondary | ICD-10-CM

## 2023-06-01 ENCOUNTER — Ambulatory Visit (HOSPITAL_COMMUNITY)
Admission: RE | Admit: 2023-06-01 | Discharge: 2023-06-01 | Disposition: A | Discharge status: Home / Self Care | Payer: Medicare HMO | Source: Ambulatory Visit | Attending: Family Medicine

## 2023-06-01 ENCOUNTER — Ambulatory Visit (HOSPITAL_COMMUNITY)
Admission: RE | Admit: 2023-06-01 | Discharge: 2023-06-01 | Disposition: A | Discharge status: Home / Self Care | Payer: Medicare HMO | Source: Ambulatory Visit | Attending: Family Medicine | Admitting: Family Medicine

## 2023-06-01 DIAGNOSIS — R92323 Mammographic fibroglandular density, bilateral breasts: Secondary | ICD-10-CM | POA: Diagnosis not present

## 2023-06-01 DIAGNOSIS — N6002 Solitary cyst of left breast: Secondary | ICD-10-CM | POA: Diagnosis not present

## 2023-06-01 DIAGNOSIS — R928 Other abnormal and inconclusive findings on diagnostic imaging of breast: Secondary | ICD-10-CM | POA: Diagnosis not present

## 2023-06-03 ENCOUNTER — Telehealth: Payer: Self-pay | Admitting: Cardiology

## 2023-06-03 ENCOUNTER — Encounter: Payer: Self-pay | Admitting: Cardiology

## 2023-06-03 ENCOUNTER — Ambulatory Visit: Payer: Medicare HMO | Attending: Cardiology | Admitting: Cardiology

## 2023-06-03 VITALS — BP 135/77 | HR 76 | Ht 64.0 in | Wt 147.2 lb

## 2023-06-03 DIAGNOSIS — R42 Dizziness and giddiness: Secondary | ICD-10-CM | POA: Diagnosis not present

## 2023-06-03 DIAGNOSIS — I6529 Occlusion and stenosis of unspecified carotid artery: Secondary | ICD-10-CM

## 2023-06-03 MED ORDER — FUROSEMIDE 20 MG PO TABS: 20.0000 mg | ORAL_TABLET | Freq: Every day | ORAL | Status: AC

## 2023-06-03 NOTE — Progress Notes (Signed)
Clinical Summary Tamara Sandoval is a 83 y.o.female seen today as a new consult, referred by NP Jimmye Norman for the following medical problems  1.Dizziness - symptoms occur with standing.  - pcp notes document sitting SBP 160 -->120 with standing - orthostatics today show sitting to standing SBP 146-->129, DBP 71-->70  - symptoms started about 6 months ago.  - dizziness would occur after standing. No syncope.  - reports was not staying well hydrated in the past around that time. Was also on higher lasix dosing 12f 40mg  daily - has increased hydration to some degree though still not getting 2L, lasix was lowered to 20mg  daily. With these changes symptoms are much improved but not resolved.    2. Carotid stenosis - 2020 mild bilateral disease    Past Medical History:  Diagnosis Date   Cervical spondylosis without myelopathy 07/30/2014   Classic migraine 07/30/2014   Glaucoma    Hypercholesteremia    Hypertension    Hypothyroid    Neck pain      Allergies  Allergen Reactions   Bactrim [Sulfamethoxazole-Trimethoprim] Rash     Current Outpatient Medications  Medication Sig Dispense Refill   ALPRAZolam (XANAX) 0.5 MG tablet Take 0.5 mg by mouth at bedtime as needed for anxiety.     atorvastatin (LIPITOR) 10 MG tablet Take 10 mg by mouth daily.     dexlansoprazole (DEXILANT) 60 MG capsule Take 1 capsule (60 mg total) by mouth daily. 30 capsule 11   estradiol (ESTRACE) 1 MG tablet Take 1 mg by mouth daily.     gabapentin (NEURONTIN) 100 MG capsule Take 100 mg by mouth 3 (three) times daily.     LATANOPROST OP Apply 1 drop to eye daily.     levothyroxine (SYNTHROID, LEVOTHROID) 100 MCG tablet Take 100 mcg by mouth daily before breakfast.     lisinopril (PRINIVIL,ZESTRIL) 10 MG tablet Take 10 mg by mouth daily.     LUMIGAN 0.01 % SOLN      omeprazole (PRILOSEC) 40 MG capsule Take 40 mg by mouth daily.     triamterene-hydrochlorothiazide (DYAZIDE) 37.5-25 MG per capsule Take 1  capsule by mouth daily.     No current facility-administered medications for this visit.     Past Surgical History:  Procedure Laterality Date   CHOLECYSTECTOMY  1978   COLONOSCOPY  2012   Dr. Genelle Gather in [place]: sigmoid colon diverticula   ESOPHAGOGASTRODUODENOSCOPY N/A 07/18/2015   Procedure: ESOPHAGOGASTRODUODENOSCOPY (EGD);  Surgeon: Corbin Ade, MD;  Location: [facility];  Service: Endoscopy;  Laterality: N/A;  300   TOTAL ABDOMINAL HYSTERECTOMY       Allergies  Allergen Reactions   Bactrim [Sulfamethoxazole-Trimethoprim] Rash      Family History  Problem Relation Age of Onset   Pneumonia Mother    Tuberculosis Mother    Cancer Cousin        ovarian   Hypercholesterolemia Cousin    Hypertension Sister    Cancer Brother    Hypertension Maternal Aunt    Dementia Paternal Aunt    Hypertension Sister    Colon cancer Neg Hx      Social History Ms. Bodner reports that she has never smoked. She has never used smokeless tobacco. Ms. Aldaz reports no history of alcohol use.    Physical Examination Today's Vitals   06/03/23 1330  BP: 135/77  Pulse: 76  SpO2: 98%  Weight: 147 lb 3.2 oz (66.8 kg)  Height: 5\' 4"  (1.626 m)  Body mass index is 25.27 kg/m.  Gen: resting comfortably, no acute distress HEENT: no scleral icterus, pupils equal round and reactive, no palptable cervical adenopathy,  CV: RRR, no mrg, no jvd Resp: Clear to auscultation bilaterally GI: abdomen is soft, non-tender, non-distended, normal bowel sounds, no hepatosplenomegaly MSK: extremities are warm, no edema.  Skin: warm, no rash Neuro:  no focal deficits Psych: appropriate affect    Assessment and Plan  1.Orthostatic dizziness - symptoms improved but not resolved after increasing her hydration and lowering lasix to 20mg  daily - hydration is still on the low side, she will work to get at least 2L of noncaffeinated drinks a day. Monitor symptoms - EKG today shows NSR  2.  Carotid stenosis - due for repeat imaging, we will order carotid US    F/u 6 months  Antoine Poche, M.D.

## 2023-06-03 NOTE — Telephone Encounter (Signed)
Checking percert on the following    CARTOID STUDY 06/21/2023

## 2023-06-03 NOTE — Patient Instructions (Addendum)
Medication Instructions:  Continue all current medications.  Labwork: none  Testing/Procedures: Your physician has requested that you have a carotid duplex. This test is an ultrasound of the carotid arteries in your neck. It looks at blood flow through these arteries that supply the brain with blood. Allow one hour for this exam. There are no restrictions or special instructions. Office will contact with results via phone, letter or mychart.     Follow-Up: 6 months   Any Other Special Instructions Will Be Listed Below (If Applicable).   If you need a refill on your cardiac medications before your next appointment, please call your pharmacy.

## 2023-06-21 ENCOUNTER — Ambulatory Visit: Payer: Medicare HMO | Attending: Cardiology

## 2023-06-21 DIAGNOSIS — I6523 Occlusion and stenosis of bilateral carotid arteries: Secondary | ICD-10-CM

## 2023-06-21 DIAGNOSIS — I6529 Occlusion and stenosis of unspecified carotid artery: Secondary | ICD-10-CM

## 2023-07-01 ENCOUNTER — Telehealth: Payer: Self-pay | Admitting: *Deleted

## 2023-07-01 NOTE — Telephone Encounter (Signed)
-----   Message from Dina Rich sent at 06/23/2023  9:59 AM EST ----- Just mild plaque in the arteries of the neck, we will continue to monitor  Dominga Ferry MD

## 2023-07-01 NOTE — Telephone Encounter (Signed)
 Notified, copy to pcp. Me     07/01/23  2:45 PM Result Note Left message to return call.

## 2023-08-24 DIAGNOSIS — H401130 Primary open-angle glaucoma, bilateral, stage unspecified: Secondary | ICD-10-CM | POA: Diagnosis not present

## 2023-08-24 DIAGNOSIS — H43813 Vitreous degeneration, bilateral: Secondary | ICD-10-CM | POA: Diagnosis not present

## 2023-08-24 DIAGNOSIS — H04123 Dry eye syndrome of bilateral lacrimal glands: Secondary | ICD-10-CM | POA: Diagnosis not present

## 2023-08-24 DIAGNOSIS — H4322 Crystalline deposits in vitreous body, left eye: Secondary | ICD-10-CM | POA: Diagnosis not present

## 2023-09-29 DIAGNOSIS — N189 Chronic kidney disease, unspecified: Secondary | ICD-10-CM | POA: Diagnosis not present

## 2023-09-29 DIAGNOSIS — E039 Hypothyroidism, unspecified: Secondary | ICD-10-CM | POA: Diagnosis not present

## 2023-10-05 DIAGNOSIS — I129 Hypertensive chronic kidney disease with stage 1 through stage 4 chronic kidney disease, or unspecified chronic kidney disease: Secondary | ICD-10-CM | POA: Diagnosis not present

## 2023-10-05 DIAGNOSIS — E538 Deficiency of other specified B group vitamins: Secondary | ICD-10-CM | POA: Diagnosis not present

## 2023-10-05 DIAGNOSIS — N189 Chronic kidney disease, unspecified: Secondary | ICD-10-CM | POA: Diagnosis not present

## 2023-10-05 DIAGNOSIS — E039 Hypothyroidism, unspecified: Secondary | ICD-10-CM | POA: Diagnosis not present

## 2023-10-05 DIAGNOSIS — B0229 Other postherpetic nervous system involvement: Secondary | ICD-10-CM | POA: Diagnosis not present

## 2023-10-05 DIAGNOSIS — R928 Other abnormal and inconclusive findings on diagnostic imaging of breast: Secondary | ICD-10-CM | POA: Diagnosis not present

## 2023-10-05 DIAGNOSIS — E559 Vitamin D deficiency, unspecified: Secondary | ICD-10-CM | POA: Diagnosis not present

## 2023-10-05 DIAGNOSIS — E785 Hyperlipidemia, unspecified: Secondary | ICD-10-CM | POA: Diagnosis not present

## 2023-10-05 DIAGNOSIS — F419 Anxiety disorder, unspecified: Secondary | ICD-10-CM | POA: Diagnosis not present

## 2023-10-05 DIAGNOSIS — I10 Essential (primary) hypertension: Secondary | ICD-10-CM | POA: Diagnosis not present

## 2023-10-05 DIAGNOSIS — H409 Unspecified glaucoma: Secondary | ICD-10-CM | POA: Diagnosis not present

## 2023-10-05 DIAGNOSIS — R600 Localized edema: Secondary | ICD-10-CM | POA: Diagnosis not present

## 2023-10-07 ENCOUNTER — Other Ambulatory Visit (HOSPITAL_COMMUNITY): Payer: Self-pay | Admitting: Nurse Practitioner

## 2023-10-07 DIAGNOSIS — Z13820 Encounter for screening for osteoporosis: Secondary | ICD-10-CM

## 2023-10-11 ENCOUNTER — Ambulatory Visit (HOSPITAL_COMMUNITY)
Admission: RE | Admit: 2023-10-11 | Discharge: 2023-10-11 | Disposition: A | Discharge status: Home / Self Care | Source: Ambulatory Visit | Attending: Nurse Practitioner | Admitting: Nurse Practitioner

## 2023-10-11 DIAGNOSIS — Z780 Asymptomatic menopausal state: Secondary | ICD-10-CM | POA: Insufficient documentation

## 2023-10-11 DIAGNOSIS — Z13820 Encounter for screening for osteoporosis: Secondary | ICD-10-CM | POA: Insufficient documentation

## 2023-10-27 DIAGNOSIS — I129 Hypertensive chronic kidney disease with stage 1 through stage 4 chronic kidney disease, or unspecified chronic kidney disease: Secondary | ICD-10-CM | POA: Diagnosis not present

## 2023-10-27 DIAGNOSIS — F419 Anxiety disorder, unspecified: Secondary | ICD-10-CM | POA: Diagnosis not present

## 2023-10-27 DIAGNOSIS — E039 Hypothyroidism, unspecified: Secondary | ICD-10-CM | POA: Diagnosis not present

## 2023-10-27 DIAGNOSIS — R600 Localized edema: Secondary | ICD-10-CM | POA: Diagnosis not present

## 2023-10-27 DIAGNOSIS — I10 Essential (primary) hypertension: Secondary | ICD-10-CM | POA: Diagnosis not present

## 2023-10-27 DIAGNOSIS — N189 Chronic kidney disease, unspecified: Secondary | ICD-10-CM | POA: Diagnosis not present

## 2023-10-27 DIAGNOSIS — R55 Syncope and collapse: Secondary | ICD-10-CM | POA: Diagnosis not present

## 2023-10-27 DIAGNOSIS — R42 Dizziness and giddiness: Secondary | ICD-10-CM | POA: Diagnosis not present

## 2023-10-27 DIAGNOSIS — L821 Other seborrheic keratosis: Secondary | ICD-10-CM | POA: Diagnosis not present

## 2023-11-02 DIAGNOSIS — H4322 Crystalline deposits in vitreous body, left eye: Secondary | ICD-10-CM | POA: Diagnosis not present

## 2023-11-02 DIAGNOSIS — Z961 Presence of intraocular lens: Secondary | ICD-10-CM | POA: Diagnosis not present

## 2023-11-02 DIAGNOSIS — H43813 Vitreous degeneration, bilateral: Secondary | ICD-10-CM | POA: Diagnosis not present

## 2023-11-02 DIAGNOSIS — H401131 Primary open-angle glaucoma, bilateral, mild stage: Secondary | ICD-10-CM | POA: Diagnosis not present

## 2023-11-02 DIAGNOSIS — H04123 Dry eye syndrome of bilateral lacrimal glands: Secondary | ICD-10-CM | POA: Diagnosis not present

## 2023-12-08 ENCOUNTER — Other Ambulatory Visit (HOSPITAL_COMMUNITY): Payer: Self-pay | Admitting: Nurse Practitioner

## 2023-12-08 DIAGNOSIS — E039 Hypothyroidism, unspecified: Secondary | ICD-10-CM | POA: Diagnosis not present

## 2023-12-08 DIAGNOSIS — M25552 Pain in left hip: Secondary | ICD-10-CM | POA: Diagnosis not present

## 2023-12-08 DIAGNOSIS — R42 Dizziness and giddiness: Secondary | ICD-10-CM | POA: Diagnosis not present

## 2023-12-08 DIAGNOSIS — I129 Hypertensive chronic kidney disease with stage 1 through stage 4 chronic kidney disease, or unspecified chronic kidney disease: Secondary | ICD-10-CM | POA: Diagnosis not present

## 2023-12-08 DIAGNOSIS — R55 Syncope and collapse: Secondary | ICD-10-CM | POA: Diagnosis not present

## 2023-12-08 DIAGNOSIS — I10 Essential (primary) hypertension: Secondary | ICD-10-CM | POA: Diagnosis not present

## 2023-12-08 DIAGNOSIS — Z79899 Other long term (current) drug therapy: Secondary | ICD-10-CM | POA: Diagnosis not present

## 2023-12-08 DIAGNOSIS — N189 Chronic kidney disease, unspecified: Secondary | ICD-10-CM | POA: Diagnosis not present

## 2023-12-14 ENCOUNTER — Ambulatory Visit (HOSPITAL_COMMUNITY)
Admission: RE | Admit: 2023-12-14 | Discharge: 2023-12-14 | Disposition: A | Discharge status: Home / Self Care | Source: Ambulatory Visit | Attending: Nurse Practitioner | Admitting: Nurse Practitioner

## 2023-12-14 DIAGNOSIS — M160 Bilateral primary osteoarthritis of hip: Secondary | ICD-10-CM | POA: Diagnosis not present

## 2023-12-14 DIAGNOSIS — M25552 Pain in left hip: Secondary | ICD-10-CM | POA: Diagnosis not present

## 2024-03-14 DIAGNOSIS — E039 Hypothyroidism, unspecified: Secondary | ICD-10-CM | POA: Diagnosis not present

## 2024-03-14 DIAGNOSIS — E538 Deficiency of other specified B group vitamins: Secondary | ICD-10-CM | POA: Diagnosis not present

## 2024-03-14 DIAGNOSIS — E559 Vitamin D deficiency, unspecified: Secondary | ICD-10-CM | POA: Diagnosis not present
